# Patient Record
Sex: Female | Born: 1995 | Hispanic: No | Marital: Married | Smoking: Never smoker
Health system: Southern US, Community
[De-identification: ages and names within clinical notes are randomized; demographics above are authoritative.]

## PROBLEM LIST (undated history)

## (undated) ENCOUNTER — Inpatient Hospital Stay (HOSPITAL_COMMUNITY): Payer: Self-pay

## (undated) DIAGNOSIS — E119 Type 2 diabetes mellitus without complications: Secondary | ICD-10-CM

## (undated) HISTORY — PX: NO PAST SURGERIES: SHX2092

---
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
## 2014-12-16 ENCOUNTER — Inpatient Hospital Stay (HOSPITAL_COMMUNITY)
Admission: AD | Admit: 2014-12-16 | Discharge: 2014-12-16 | Disposition: A | Discharge status: Home / Self Care | Payer: PPO | Attending: Obstetrics & Gynecology | Admitting: Obstetrics & Gynecology

## 2014-12-16 ENCOUNTER — Other Ambulatory Visit: Payer: Self-pay | Admitting: Medical

## 2014-12-16 DIAGNOSIS — N63 Unspecified lump in unspecified breast: Secondary | ICD-10-CM

## 2014-12-16 NOTE — Discharge Instructions (Signed)
Fibrocystic Breast Changes Fibrocystic breast changes happen when tiny sacs filled with fluid form in the breast. They are not cancer. They can feel like lumps. HOME CARE  Check your breasts after every menstrual period or the first day of every month if you do not have menstrual periods. Check for:  Soreness.  New puffiness (swelling).  A change in breast size.  A change in a lump that was already there.  Only take medicine as told by your doctor.  Wear a support or sports bra that fits well.  Avoid caffeine in pop, chocolate, coffee, and tea. GET HELP IF:   You have fluid coming from your nipples, especially if it is bloody.  You have new lumps or bumps in your breast.  Your breast becomes puffy, red, and painful.  You have changes in how your breast looks.  Your nipples look flat or sunk in.   This information is not intended to replace advice given to you by your health care provider. Make sure you discuss any questions you have with your health care provider.   Document Released: 12/31/2007 Document Revised: 01/22/2013 Document Reviewed: 07/08/2012 Elsevier Interactive Patient Education [employer]2016 Elsevier Inc.

## 2014-12-16 NOTE — MAU Note (Signed)
Pt C/O lump in R breast since last Friday.  Lump is not painful.

## 2014-12-16 NOTE — MAU Provider Note (Signed)
History     CSN: [id number]  Arrival date and time: 12/16/14 1653   First Provider Initiated Contact with Patient 12/16/14 1709      Chief Complaint  Patient presents with  . Breast Mass   HPI [name] is a 19 y.o. G1P1001 who presents to MAU today with complaint of breast lump in the right breast since Friday. The patient states that this is a firm nodule. It is non-tender. There is no nipple drainage or bleeding. She discontinued breastfeeding a couple months ago. She denies fever.    OB History    Gravida Para Term Preterm AB TAB SAB Ectopic Multiple Living   Past Medical History  Diagnosis Date  . Diabetes mellitus without complication     gestational    Past Surgical History  Procedure Laterality Date  . No past surgeries      Family History  Problem Relation Age of Onset  . Alcohol abuse Neg Hx   . Arthritis Neg Hx   . Asthma Neg Hx   . Birth defects Neg Hx   . Cancer Neg Hx   . COPD Neg Hx   . Diabetes Neg Hx   . Depression Neg Hx   . Drug abuse Neg Hx   . Early death Neg Hx   . Hearing loss Neg Hx   . Heart disease Neg Hx   . Hyperlipidemia Neg Hx   . Hypertension Neg Hx   . Kidney disease Neg Hx   . Learning disabilities Neg Hx   . Mental illness Neg Hx   . Mental retardation Neg Hx   . Miscarriages / Stillbirths Neg Hx   . Stroke Neg Hx   . Vision loss Neg Hx   . Varicose Veins Neg Hx     Social History  Substance Use Topics  . Smoking status: Never Smoker   . Smokeless tobacco: Never Used  . Alcohol Use: No    Allergies: No Known Allergies  No prescriptions prior to admission    Review of Systems  Constitutional: Negative for fever and malaise/fatigue.  Genitourinary:       + breast lump   Physical Exam   Blood pressure 105/75, pulse 71, temperature 98.6 F (37 C), temperature source Oral, resp. rate 16, last menstrual period 12/12/2014, unknown if currently breastfeeding.  Physical Exam   Nursing note and vitals reviewed. Constitutional: She is oriented to person, place, and time. She appears well-developed and well-nourished. No distress.  HENT:  Head: Normocephalic and atraumatic.  Cardiovascular: Normal rate.   Respiratory: Effort normal. Right breast exhibits mass. Right breast exhibits no inverted nipple, no nipple discharge, no skin change and no tenderness. Left breast exhibits no inverted nipple, no mass, no nipple discharge, no skin change and no tenderness. Breasts are symmetrical.  Neurological: She is alert and oriented to person, place, and time.  Skin: Skin is warm and dry. No erythema.  Psychiatric: She has a normal mood and affect.    MAU Course  Procedures None  MDM Referral to breast center  Assessment and Plan  A: Lump in breast, right   P: Discharge home Advised Ibuprofen PRN for pain Warning signs for worsening condition discussed [facility] Patient may return to MAU as needed or if her condition were to change or worsen   Marny Lowenstein, PA-C  12/16/2014,  5:33 PM

## 2014-12-18 ENCOUNTER — Other Ambulatory Visit: Payer: Self-pay | Admitting: Medical

## 2014-12-18 DIAGNOSIS — N63 Unspecified lump in unspecified breast: Secondary | ICD-10-CM

## 2014-12-24 ENCOUNTER — Ambulatory Visit
Admission: RE | Admit: 2014-12-24 | Discharge: 2014-12-24 | Disposition: A | Discharge status: Home / Self Care | Payer: Commercial Managed Care - PPO | Source: Ambulatory Visit | Attending: Medical | Admitting: Medical

## 2014-12-24 DIAGNOSIS — N63 Unspecified lump in unspecified breast: Secondary | ICD-10-CM

## 2015-09-26 IMAGING — US US OB COMP LESS 14 WK
1 series · 13 of 13 positions shown · non-contrast
Comparison: None.

CLINICAL DATA: Bleeding.

EXAM:
OBSTETRIC <14 WK ULTRASOUND
TECHNIQUE: Transabdominal ultrasound was performed for evaluation of the
gestation as well as the maternal uterus and adnexal regions.

[Series 1: us ob comp less 14 wks · 13 acquisitions, 13 frames shown]
[im 1/13]
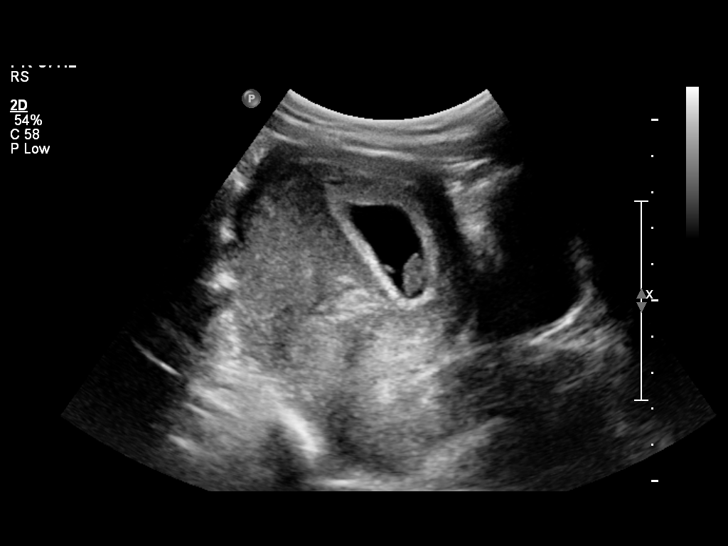
[im 2/13]
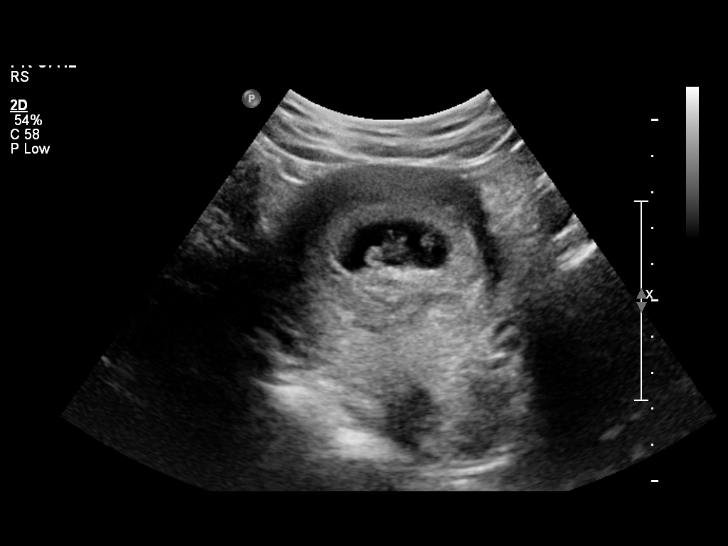
[im 3/13]
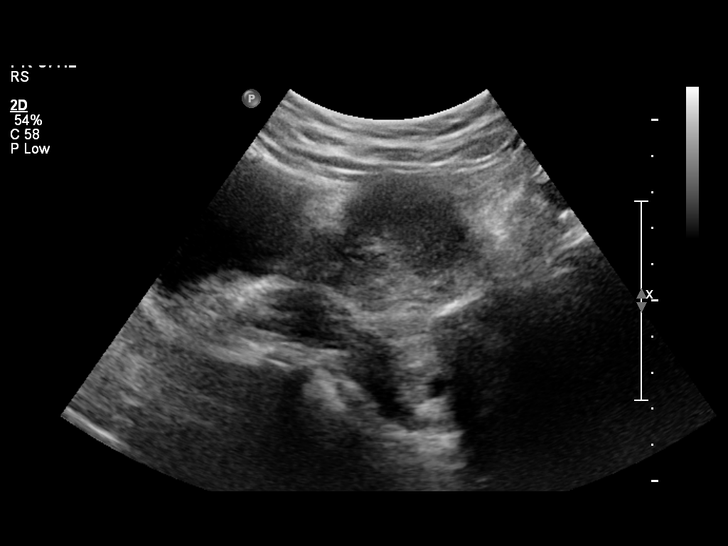
[im 4/13]
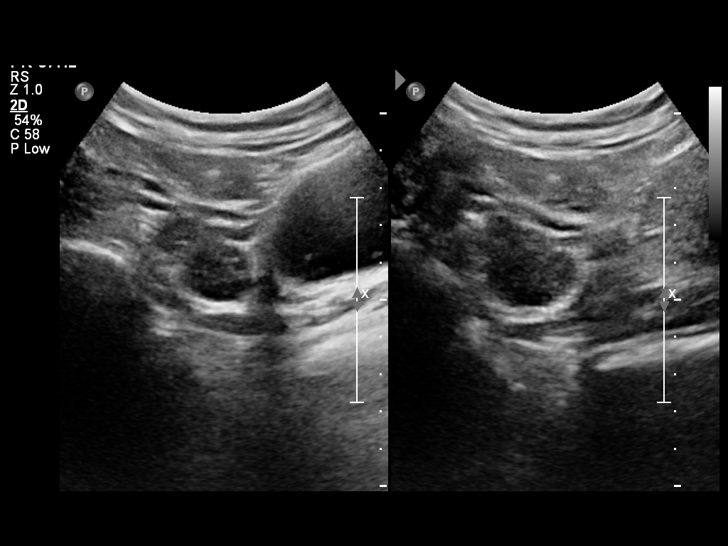
[im 5/13]
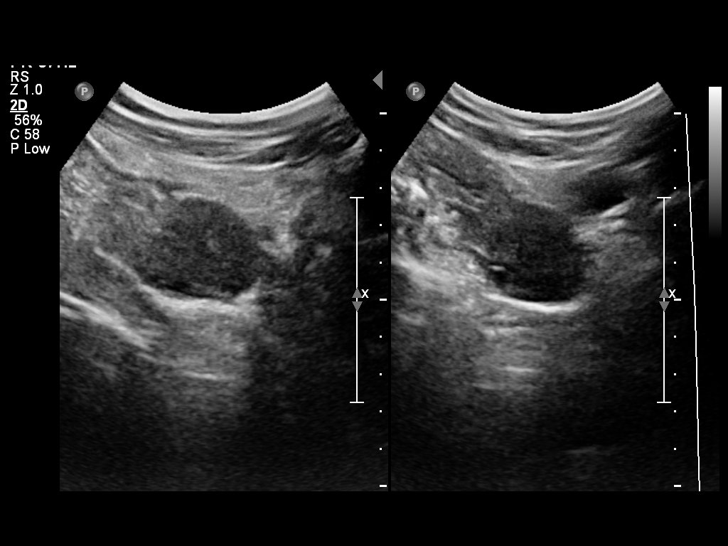
[im 6/13]
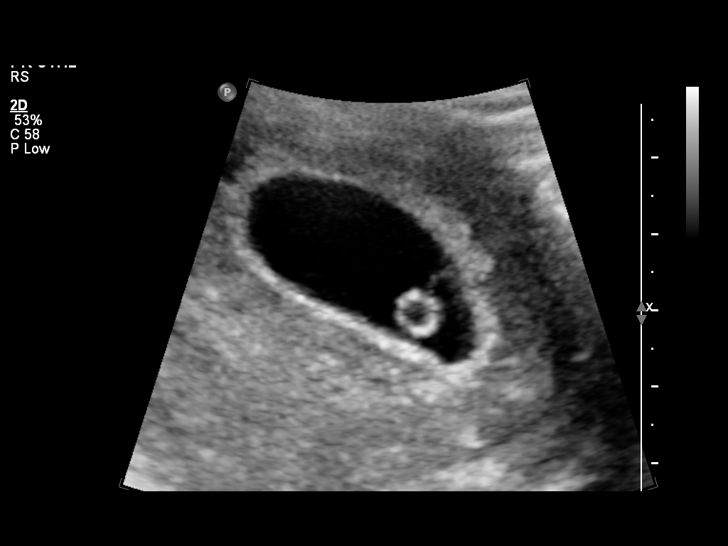
[im 7/13]
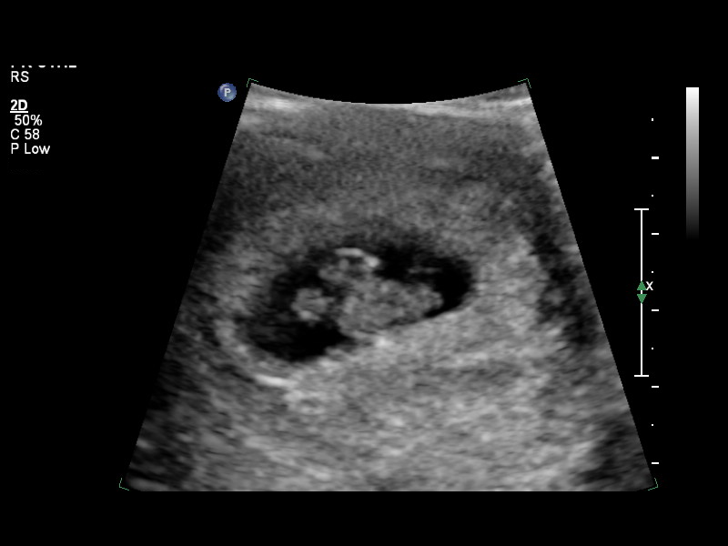
[im 8/13]
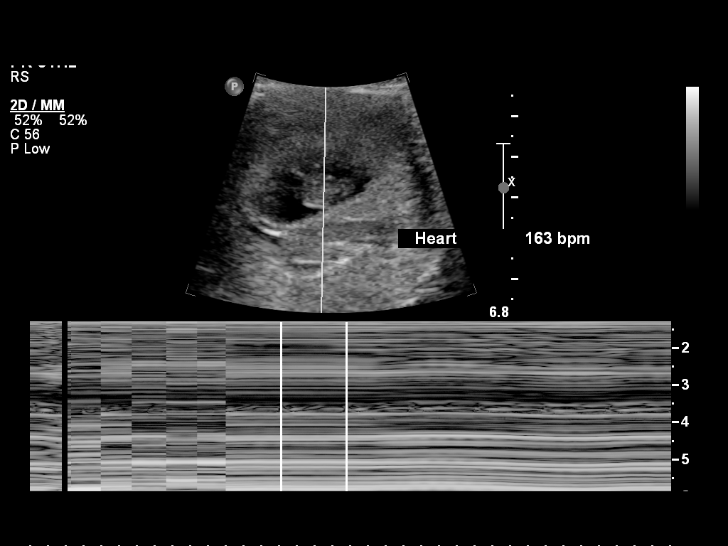
[im 9/13]
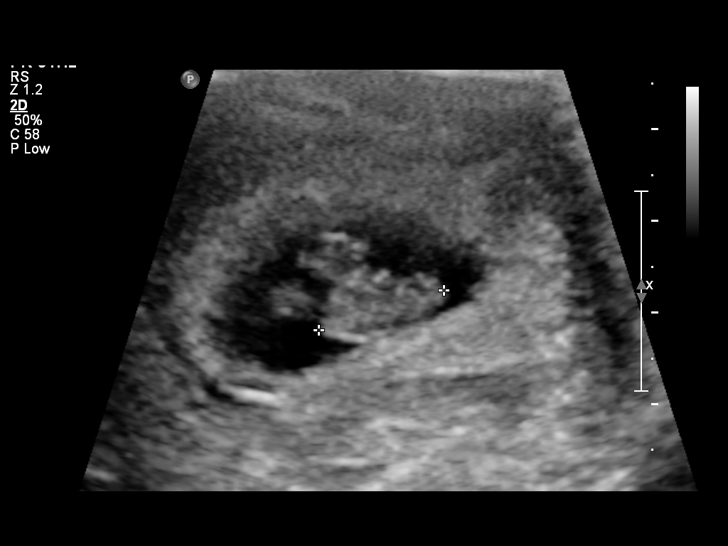
[im 10/13]
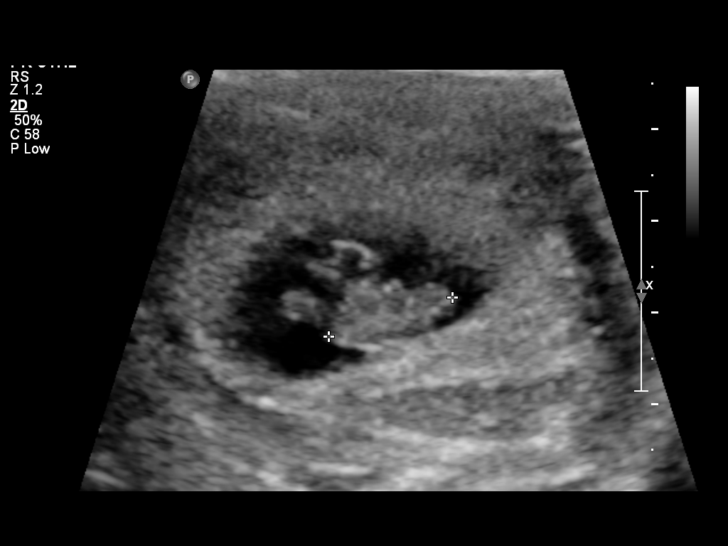
[im 11/13]
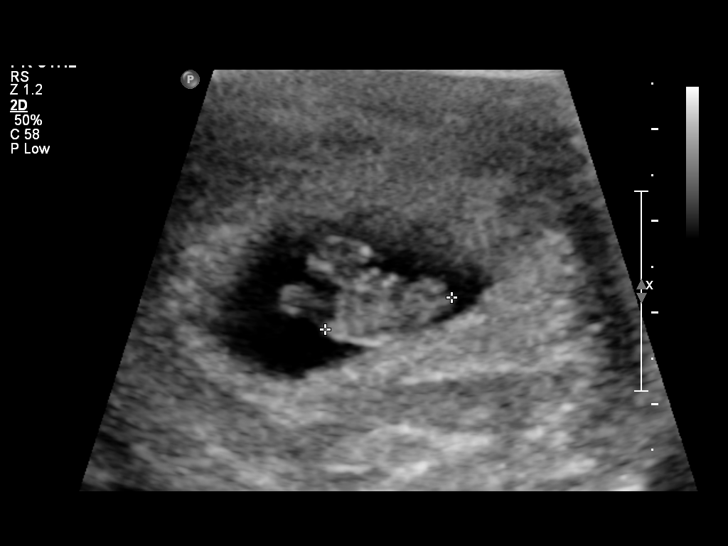
[im 12/13]
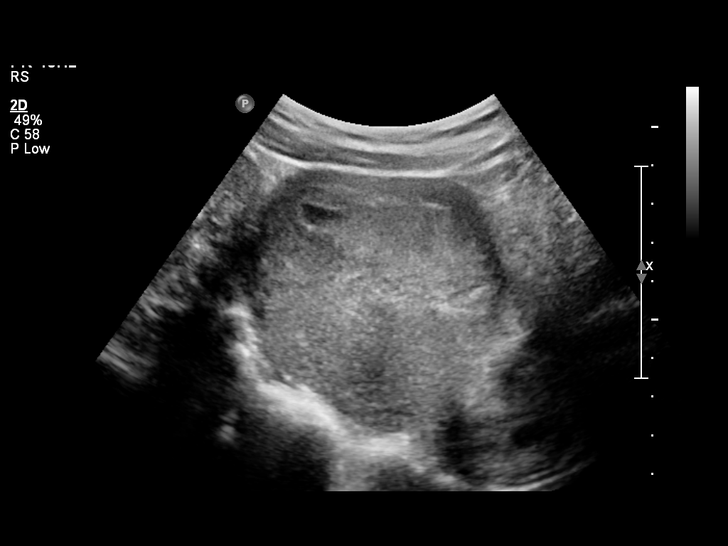
[im 13/13]
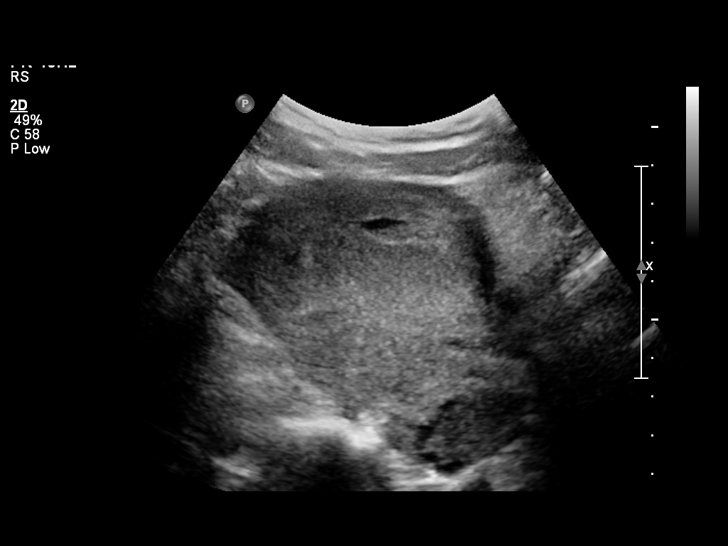

[13 of 13 positions shown; findings below may reference images not displayed]

FINDINGS: Intrauterine gestational sac: Visualized/normal in shape.

Yolk sac:  Yes

Embryo:  Yes

Cardiac Activity: Yes

Heart Rate: 163 bpm

CRL:   1.42 cm   7 w 6 d                  US EDC: 07/21/2013

Maternal uterus/adnexae:

Subchorionic hemorrhage: Small

Right ovary: Normal

Left ovary: Normal

Other :None

Free fluid: None
IMPRESSION: 1. Single living intrauterine gestation. The estimated gestational
age is 7 weeks and 6 days. 2. Small subchorionic hemorrhage

## 2016-01-20 ENCOUNTER — Ambulatory Visit (INDEPENDENT_AMBULATORY_CARE_PROVIDER_SITE_OTHER): Payer: Commercial Managed Care - PPO | Admitting: Urgent Care

## 2016-01-20 VITALS — BP 102/62 | HR 102 | Temp 99.0°F | Resp 16 | Ht 66.0 in | Wt 133.0 lb

## 2016-01-20 DIAGNOSIS — N3090 Cystitis, unspecified without hematuria: Secondary | ICD-10-CM | POA: Diagnosis not present

## 2016-01-20 DIAGNOSIS — Z3A01 Less than 8 weeks gestation of pregnancy: Secondary | ICD-10-CM | POA: Diagnosis not present

## 2016-01-20 DIAGNOSIS — Z603 Acculturation difficulty: Secondary | ICD-10-CM | POA: Diagnosis not present

## 2016-01-20 DIAGNOSIS — R630 Anorexia: Secondary | ICD-10-CM

## 2016-01-20 LAB — POCT URINALYSIS DIP (MANUAL ENTRY)
Bilirubin, UA: NEGATIVE
Glucose, UA: NEGATIVE
Leukocytes, UA: NEGATIVE
Nitrite, UA: POSITIVE — AB
Protein Ur, POC: NEGATIVE
UROBILINOGEN UA: 0.2
pH, UA: 5.5

## 2016-01-20 LAB — POCT CBC
Granulocyte percent: 46.7 %G (ref 37–80)
HCT, POC: 33.2 % — AB (ref 37.7–47.9)
Hemoglobin: 11 g/dL — AB (ref 12.2–16.2)
LYMPH, POC: 2.1 (ref 0.6–3.4)
MCH, POC: 24.4 pg — AB (ref 27–31.2)
MCHC: 33.2 g/dL (ref 31.8–35.4)
MCV: 73.5 fL — AB (ref 80–97)
MID (cbc): 0.4 (ref 0–0.9)
MPV: 8.5 fL (ref 0–99.8)
PLATELET COUNT, POC: 177 10*3/uL (ref 142–424)
POC Granulocyte: 2.1 (ref 2–6.9)
POC LYMPH %: 45.5 % (ref 10–50)
POC MID %: 7.8 %M (ref 0–12)
RBC: 4.52 M/uL (ref 4.04–5.48)
RDW, POC: 15.4 %
WBC: 4.6 10*3/uL (ref 4.6–10.2)

## 2016-01-20 LAB — POC MICROSCOPIC URINALYSIS (UMFC): MUCUS RE: ABSENT

## 2016-01-20 LAB — GLUCOSE, POCT (MANUAL RESULT ENTRY): POC Glucose: 99 mg/dl (ref 70–99)

## 2016-01-20 MED ORDER — DOXYLAMINE-PYRIDOXINE 10-10 MG PO TBEC: 2.0000 | DELAYED_RELEASE_TABLET | Freq: Two times a day (BID) | ORAL | 5 refills | Status: DC

## 2016-01-20 MED ORDER — NITROFURANTOIN MONOHYD MACRO 100 MG PO CAPS: 100.0000 mg | ORAL_CAPSULE | Freq: Two times a day (BID) | ORAL | 0 refills | Status: DC

## 2016-01-20 NOTE — Progress Notes (Signed)
MRN: [id number] DOB: 01/28/1996  Subjective:   Robyn Wade is a 20 y.o. female presenting for chief complaint of Anorexia (Pt is pregnant) and Nausea  Reports 4 day history of inability to eat food. She is able to drink fluids but this can worsen her ability to eat solid foods. Also reports nausea. [facility] and she found out she was [redacted] weeks pregnant. Has not tried medications for relief. Denies fever, vomiting, abdominal pain, dysuria, hematuria, constipation, diarrhea, bloody stools, vaginal discharge, vaginal bleeding.  Robyn Wade has a current medication list which includes the following prescription(s): ibuprofen and prenatal multivitamin. Also has No Known Allergies.  Robyn Wade  has a past medical history of Diabetes mellitus without complication (HCC). Also  has a past surgical history that includes No past surgeries.  Objective:   Vitals: BP 102/62   Pulse (!) 102   Temp 99 F (37.2 C) (Oral)   Resp 16   Ht 5\' 6"  (1.676 m)   Wt 133 lb (60.3 kg)   SpO2 99%   BMI 21.47 kg/m   Physical Exam  Constitutional: She is oriented to person, place, and time. She appears well-developed and well-nourished.  HENT:  Mouth/Throat: Oropharynx is clear and moist.  Eyes: No scleral icterus.  Cardiovascular: Normal rate, regular rhythm and intact distal pulses.  Exam reveals no gallop and no friction rub.   No murmur heard. Pulmonary/Chest: No respiratory distress. She has no wheezes. She has no rales.  Abdominal: Soft. Bowel sounds are normal. She exhibits no distension and no mass. There is tenderness. There is no guarding.  Neurological: She is alert and oriented to person, place, and time.  Skin: Skin is warm and dry.   Results for orders placed or performed in visit on 01/20/16 (from the past 24 hour(s))  POCT CBC     Status: Abnormal   Collection Time: 01/20/16  5:42 PM  Result Value Ref Range   WBC 4.6 4.6 - 10.2 K/uL   Lymph, poc 2.1  0.6 - 3.4   POC LYMPH PERCENT 45.5 10 - 50 %L   MID (cbc) 0.4 0 - 0.9   POC MID % 7.8 0 - 12 %M   POC Granulocyte 2.1 2 - 6.9   Granulocyte percent 46.7 37 - 80 %G   RBC 4.52 4.04 - 5.48 M/uL   Hemoglobin 11.0 (A) 12.2 - 16.2 g/dL   HCT, POC 56.233.2 (A) 13.037.7 - 47.9 %   MCV 73.5 (A) 80 - 97 fL   MCH, POC 24.4 (A) 27 - 31.2 pg   MCHC 33.2 31.8 - 35.4 g/dL   RDW, POC 86.515.4 %   Platelet Count, POC 177 142 - 424 K/uL   MPV 8.5 0 - 99.8 fL  POCT glucose (manual entry)     Status: None   Collection Time: 01/20/16  5:44 PM  Result Value Ref Range   POC Glucose 99 70 - 99 mg/dl  POCT urinalysis dipstick     Status: Abnormal   Collection Time: 01/20/16  5:56 PM  Result Value Ref Range   Color, UA yellow yellow   Clarity, UA clear clear   Glucose, UA negative negative   Bilirubin, UA negative negative   Ketones, POC UA trace (5) (A) negative   Spec Grav, UA >=1.030    Blood, UA trace-intact (A) negative   pH, UA 5.5    Protein Ur, POC negative negative   Urobilinogen, UA 0.2  Nitrite, UA Positive (A) Negative   Leukocytes, UA Negative Negative   Assessment and Plan :   1. Cystitis 2. Anorexia 3. Less than [redacted] weeks gestation of pregnancy 4. Language barrier, cultural differences - Start Macrobid twice daily. Urine culture pending. Patient is to start Diclegis BID, establish care with an OB.  Wallis BambergMario Ayden Apodaca, PA-C [facility] [phone] 01/20/2016 5:19 PM

## 2016-01-20 NOTE — Patient Instructions (Addendum)
Pregnancy and Urinary Tract Infection WHAT IS A URINARY TRACT INFECTION? A urinary tract infection (UTI) is an infection of any part of the urinary tract. This includes the kidneys, the tubes that connect your kidneys to your bladder (ureters), the bladder, and the tube that carries urine out of your body (urethra). These organs make, store, and get rid of urine in the body. A UTI can be a bladder infection (cystitis) or a kidney infection (pyelonephritis). This infection may be caused by fungi, viruses, and bacteria. Bacteria are the most common cause of UTIs. You are more likely to develop a UTI during pregnancy because:  The physical and hormonal changes your body goes through can make it easier for bacteria to get into your urinary tract.  Your growing baby puts pressure on your uterus and can affect urine flow. DOES A UTI PLACE MY BABY AT RISK? An untreated UTI during pregnancy could lead to a kidney infection, which can cause health problems that could affect your baby. Possible complications of an untreated UTI include:  Having your baby before 37 weeks of pregnancy (premature).  Having a baby with a low birth weight.  Developing high blood pressure during pregnancy (preeclampsia). WHAT ARE THE SYMPTOMS OF A UTI? Symptoms of a UTI include:  Fever.  Frequent urination or passing small amounts of urine frequently.  Needing to urinate urgently.  Pain or a burning sensation with urination.  Urine that smells bad or unusual.  Cloudy urine.  Pain in the lower abdomen or back.  Trouble urinating.  Blood in the urine.  Vomiting or being less hungry than normal.  Diarrhea or abdominal pain.  Vaginal discharge. WHAT ARE THE TREATMENT OPTIONS FOR A UTI DURING PREGNANCY? Treatment for this condition may include:  Antibiotic medicines that are safe to take during pregnancy.  Other medicines to treat less common causes of UTI. HOW CAN I PREVENT A UTI? To prevent a UTI:  Go  to the bathroom as soon as you feel the need.  Always wipe from front to back.  Wash your genital area with soap and warm water daily.  Empty your bladder before and after sex.  Wear cotton underwear.  Limit your intake of high sugar foods or drinks, such as regular soda, juice, and sweets..  Drink 6-8 glasses of water daily.  Do not wear tight-fitting pants.  Do not douche or use deodorant sprays.  Do not drink alcohol, caffeine, or carbonated drinks. These can irritate the bladder. WHEN SHOULD I SEEK MEDICAL CARE? Seek medical care if:  Your symptoms do not improve or get worse.  You have a fever after two days of treatment.  You have a rash.  You have abnormal vaginal discharge.  You have back or side pain.  You have chills.  You have nausea and vomiting. WHEN SHOULD I SEEK IMMEDIATE MEDICAL CARE? Seek immediate medical care if you are pregnant and:  You feel contractions in your uterus.  You have lower belly pain.  You have a gush of fluid from your vagina.  You have blood in your urine.  You are vomiting and cannot keep down any medicines or water. This information is not intended to replace advice given to you by your health care provider. Make sure you discuss any questions you have with your health care provider. Document Released: 05/14/2010 Document Revised: 06/22/2015 Document Reviewed: 12/08/2014 Elsevier Interactive Patient Education  2017 [employer]     IF you received an x-ray today, you will receive  an Economistinvoice from [facility]. Please contact [facility] at [phone] with questions or concerns regarding your invoice.   IF you received labwork today, you will receive an invoice from [facility]. Please contact [facility] at [phone] with questions or concerns regarding your invoice.   Our billing staff will not be able to assist you with questions regarding bills from these companies.  You will be contacted with the  lab results as soon as they are available. The fastest way to get your results is to activate your My Chart account. Instructions are located on the last page of this paperwork. If you have not heard from us regarding the results in 2 weeks, please contact this office.

## 2016-01-22 LAB — URINE CULTURE

## 2016-01-23 ENCOUNTER — Inpatient Hospital Stay (HOSPITAL_COMMUNITY): Payer: PPO

## 2016-01-23 ENCOUNTER — Encounter (HOSPITAL_COMMUNITY): Payer: Self-pay | Admitting: *Deleted

## 2016-01-23 ENCOUNTER — Inpatient Hospital Stay (HOSPITAL_COMMUNITY)
Admission: AD | Admit: 2016-01-23 | Discharge: 2016-01-24 | Disposition: A | Discharge status: Home / Self Care | Payer: PPO | Source: Ambulatory Visit | Attending: Obstetrics & Gynecology | Admitting: Obstetrics & Gynecology

## 2016-01-23 DIAGNOSIS — N939 Abnormal uterine and vaginal bleeding, unspecified: Secondary | ICD-10-CM | POA: Diagnosis present

## 2016-01-23 DIAGNOSIS — O209 Hemorrhage in early pregnancy, unspecified: Secondary | ICD-10-CM | POA: Diagnosis not present

## 2016-01-23 DIAGNOSIS — O99611 Diseases of the digestive system complicating pregnancy, first trimester: Secondary | ICD-10-CM | POA: Diagnosis not present

## 2016-01-23 DIAGNOSIS — O219 Vomiting of pregnancy, unspecified: Secondary | ICD-10-CM | POA: Diagnosis not present

## 2016-01-23 DIAGNOSIS — K5900 Constipation, unspecified: Secondary | ICD-10-CM

## 2016-01-23 DIAGNOSIS — Z3A01 Less than 8 weeks gestation of pregnancy: Secondary | ICD-10-CM | POA: Diagnosis not present

## 2016-01-23 DIAGNOSIS — O4690 Antepartum hemorrhage, unspecified, unspecified trimester: Secondary | ICD-10-CM

## 2016-01-23 LAB — POCT PREGNANCY, URINE: Preg Test, Ur: POSITIVE — AB

## 2016-01-23 LAB — CBC
HCT: 33.5 % — ABNORMAL LOW (ref 36.0–46.0)
HEMOGLOBIN: 10.8 g/dL — AB (ref 12.0–15.0)
MCH: 24.4 pg — ABNORMAL LOW (ref 26.0–34.0)
MCHC: 32.2 g/dL (ref 30.0–36.0)
MCV: 75.6 fL — AB (ref 78.0–100.0)
Platelets: 202 10*3/uL (ref 150–400)
RBC: 4.43 MIL/uL (ref 3.87–5.11)
RDW: 15.1 % (ref 11.5–15.5)
WBC: 5.3 10*3/uL (ref 4.0–10.5)

## 2016-01-23 LAB — URINALYSIS, ROUTINE W REFLEX MICROSCOPIC
Bilirubin Urine: NEGATIVE
Glucose, UA: NEGATIVE mg/dL
KETONES UR: NEGATIVE mg/dL
Nitrite: NEGATIVE
PH: 6 (ref 5.0–8.0)
Protein, ur: NEGATIVE mg/dL
SPECIFIC GRAVITY, URINE: 1.014 (ref 1.005–1.030)

## 2016-01-23 NOTE — MAU Note (Signed)
Unable to have BM . Last BM 3-4days ago. Some spotting in past couple days. No pain

## 2016-01-24 DIAGNOSIS — K5900 Constipation, unspecified: Secondary | ICD-10-CM | POA: Diagnosis not present

## 2016-01-24 DIAGNOSIS — O4690 Antepartum hemorrhage, unspecified, unspecified trimester: Secondary | ICD-10-CM

## 2016-01-24 DIAGNOSIS — O219 Vomiting of pregnancy, unspecified: Secondary | ICD-10-CM | POA: Diagnosis not present

## 2016-01-24 DIAGNOSIS — O99611 Diseases of the digestive system complicating pregnancy, first trimester: Secondary | ICD-10-CM

## 2016-01-24 LAB — WET PREP, GENITAL
CLUE CELLS WET PREP: NONE SEEN
SPERM: NONE SEEN
Trich, Wet Prep: NONE SEEN
Yeast Wet Prep HPF POC: NONE SEEN

## 2016-01-24 LAB — HCG, QUANTITATIVE, PREGNANCY: hCG, Beta Chain, Quant, S: 61898 m[IU]/mL — ABNORMAL HIGH (ref <5)

## 2016-01-24 LAB — HIV ANTIBODY (ROUTINE TESTING W REFLEX): HIV SCREEN 4TH GENERATION: NONREACTIVE

## 2016-01-24 MED ORDER — POLYETHYLENE GLYCOL 3350 17 G PO PACK
17.0000 g | PACK | Freq: Once | ORAL | Status: AC
  Administered 2016-01-24: 17 g via ORAL
  Filled 2016-01-24: qty 1

## 2016-01-24 MED ORDER — PROMETHAZINE HCL 25 MG PO TABS: 25.0000 mg | ORAL_TABLET | Freq: Four times a day (QID) | ORAL | 2 refills | Status: DC | PRN

## 2016-01-24 MED ORDER — POLYETHYLENE GLYCOL 3350 17 GM/SCOOP PO POWD: 17.0000 g | Freq: Every day | ORAL | 2 refills | Status: AC | PRN

## 2016-01-24 MED ORDER — PROMETHAZINE HCL 25 MG PO TABS
25.0000 mg | ORAL_TABLET | Freq: Once | ORAL | Status: AC
  Administered 2016-01-24: 25 mg via ORAL
  Filled 2016-01-24 (×2): qty 1

## 2016-01-24 NOTE — Discharge Instructions (Signed)
Constipation, Adult Constipation is when a person has fewer bowel movements in a week than normal, has difficulty having a bowel movement, or has stools that are dry, hard, or larger than normal. Constipation may be caused by an underlying condition. It may become worse with age if a person takes certain medicines and does not take in enough fluids. Follow these instructions at home: Eating and drinking  Eat foods that have a lot of fiber, such as fresh fruits and vegetables, whole grains, and beans.  Limit foods that are high in fat, low in fiber, or overly processed, such as french fries, hamburgers, cookies, candies, and soda.  Drink enough fluid to keep your urine clear or pale yellow. General instructions  Exercise regularly or as told by your health care provider.  Go to the restroom when you have the urge to go. Do not hold it in.  Take over-the-counter and prescription medicines only as told by your health care provider. These include any fiber supplements.  Practice pelvic floor retraining exercises, such as deep breathing while relaxing the lower abdomen and pelvic floor relaxation during bowel movements.  Watch your condition for any changes.  Keep all follow-up visits as told by your health care provider. This is important. Contact a health care provider if:  You have pain that gets worse.  You have a fever.  You do not have a bowel movement after 4 days.  You vomit.  You are not hungry.  You lose weight.  You are bleeding from the anus.  You have thin, pencil-like stools. Get help right away if:  You have a fever and your symptoms suddenly get worse.  You leak stool or have blood in your stool.  Your abdomen is bloated.  You have severe pain in your abdomen.  You feel dizzy or you faint. This information is not intended to replace advice given to you by your health care provider. Make sure you discuss any questions you have with your health care  provider. Document Released: 10/16/2003 Document Revised: 08/07/2015 Document Reviewed: 07/08/2015 Elsevier Interactive Patient Education  2017 Elsevier Inc.   High-Fiber Diet Fiber, also called dietary fiber, is a type of carbohydrate found in fruits, vegetables, whole grains, and beans. A high-fiber diet can have many health benefits. Your health care provider may recommend a high-fiber diet to help:  Prevent constipation. Fiber can make your bowel movements more regular.  Lower your cholesterol.  Relieve hemorrhoids, uncomplicated diverticulosis, or irritable bowel syndrome.  Prevent overeating as part of a weight-loss plan.  Prevent heart disease, type 2 diabetes, and certain cancers. What is my plan? The recommended daily intake of fiber includes:  38 grams for men under age 21.  30 grams for men over age 21.  25 grams for women under age 21.  21 grams for women over age 21. You can get the recommended daily intake of dietary fiber by eating a variety of fruits, vegetables, grains, and beans. Your health care provider may also recommend a fiber supplement if it is not possible to get enough fiber through your diet. What do I need to know about a high-fiber diet?  Fiber supplements have not been widely studied for their effectiveness, so it is better to get fiber through food sources.  Always check the fiber content on thenutrition facts label of any prepackaged food. Look for foods that contain at least 5 grams of fiber per serving.  Ask your dietitian if you have questions about specific foods  that are related to your condition, especially if those foods are not listed in the following section.  Increase your daily fiber consumption gradually. Increasing your intake of dietary fiber too quickly may cause bloating, cramping, or gas.  Drink plenty of water. Water helps you to digest fiber. What foods can I eat? Grains  Whole-grain breads. Multigrain cereal. Oats and  oatmeal. Brown rice. Barley. Bulgur wheat. Millet. Bran muffins. Popcorn. Rye wafer crackers. Vegetables  Sweet potatoes. Spinach. Kale. Artichokes. Cabbage. Broccoli. Green peas. Carrots. Squash. Fruits  Berries. Pears. Apples. Oranges. Avocados. Prunes and raisins. Dried figs. Meats and Other Protein Sources  Navy, kidney, pinto, and soy beans. Split peas. Lentils. Nuts and seeds. Dairy  Fiber-fortified yogurt. Beverages  Fiber-fortified soy milk. Fiber-fortified orange juice. Other  Fiber bars. The items listed above may not be a complete list of recommended foods or beverages. Contact your dietitian for more options.  What foods are not recommended? Grains  White bread. Pasta made with refined flour. White rice. Vegetables  Fried potatoes. Canned vegetables. Well-cooked vegetables. Fruits  Fruit juice. Cooked, strained fruit. Meats and Other Protein Sources  Fatty cuts of meat. Fried Environmental education officerpoultry or fried fish. Dairy  Milk. Yogurt. Cream cheese. Sour cream. Beverages  Soft drinks. Other  Cakes and pastries. Butter and oils. The items listed above may not be a complete list of foods and beverages to avoid. Contact your dietitian for more information.  What are some tips for including high-fiber foods in my diet?  Eat a wide variety of high-fiber foods.  Make sure that half of all grains consumed each day are whole grains.  Replace breads and cereals made from refined flour or white flour with whole-grain breads and cereals.  Replace white rice with brown rice, bulgur wheat, or millet.  Start the day with a breakfast that is high in fiber, such as a cereal that contains at least 5 grams of fiber per serving.  Use beans in place of meat in soups, salads, or pasta.  Eat high-fiber snacks, such as berries, raw vegetables, nuts, or popcorn. This information is not intended to replace advice given to you by your health care provider. Make sure you discuss any questions you have  with your health care provider. Document Released: 01/17/2005 Document Revised: 06/25/2015 Document Reviewed: 07/02/2013 Elsevier Interactive Patient Education  2017 Elsevier Inc.   Vaginal Bleeding During Pregnancy, First Trimester A small amount of bleeding (spotting) from the vagina is common in early pregnancy. Sometimes the bleeding is normal and is not a problem, and sometimes it is a sign of something serious. Be sure to tell your doctor about any bleeding from your vagina right away. Follow these instructions at home:  Watch your condition for any changes.  Follow your doctor's instructions about how active you can be.  If you are on bed rest:  You may need to stay in bed and only get up to use the bathroom.  You may be allowed to do some activities.  If you need help, make plans for someone to help you.  Write down:  The number of pads you use each day.  How often you change pads.  How soaked (saturated) your pads are.  Do not use tampons.  Do not douche.  Do not have sex or orgasms until your doctor says it is okay.  If you pass any tissue from your vagina, save the tissue so you can show it to your doctor.  Only take medicines as told  by your doctor.  Do not take aspirin because it can make you bleed.  Keep all follow-up visits as told by your doctor. Contact a doctor if:  You bleed from your vagina.  You have cramps.  You have labor pains.  You have a fever that does not go away after you take medicine. Get help right away if:  You have very bad cramps in your back or belly (abdomen).  You pass large clots or tissue from your vagina.  You bleed more.  You feel light-headed or weak.  You pass out (faint).  You have chills.  You are leaking fluid or have a gush of fluid from your vagina.  You pass out while pooping (having a bowel movement). This information is not intended to replace advice given to you by your health care provider. Make  sure you discuss any questions you have with your health care provider. Document Released: 06/03/2013 Document Revised: 06/25/2015 Document Reviewed: 09/24/2012 Elsevier Interactive Patient Education  2017 Elsevier Inc.  [place] [facility] Providers    [facility] [employer] [facility]       Phone: [phone]  [facility] [employer] at [place] Phone: [phone]  [facility] [employer] at [place]  Phone: [phone]  [facility] for [facility]  Phone: [phone]  [facility] for [facility] at [place]  Phone: [phone]  [facility]       Phone: [phone]  [facility]    Phone: [phone]   [facility] [place])    Phone: [phone]  [facility]    Phone: [phone]  [facility]    Phone: [phone]  [facility]    Phone: [phone]  [facility]       Phone: [phone]   [facility]  Phone: [phone]  [facility]    Phone: [phone]  [facility]   Phone: [phone]  [facility]      Phone: [phone]  [facility]    Phone: [phone]

## 2016-01-24 NOTE — MAU Provider Note (Signed)
Chief Complaint: Constipation   First Provider Initiated Contact with Patient 01/24/16 0045     SUBJECTIVE HPI: Robyn Wade is a 20 y.o. G3P1001 at 1772w4d who presents to Maternity Admissions reporting light vaginal bleeding x a few days and constipation. No BM in 3-4 days. Recently Rx'd Diclegis for N/V (which is helping), and thinks constipation is from Diclegis. Hasn't tried any dietary changes or medication for constipation.   Associated signs and symptoms: Neg for abd pain , passage of clots or tissue.   Pt requesting to use husband for interpreting. Declined hospital interpreter.   Past Medical History:  Diagnosis Date  . Diabetes mellitus without complication (HCC)    gestational   OB History  Gravida Para Term Preterm AB Living  3 1 1     1   SAB TAB Ectopic Multiple Live Births          1    # Outcome Date GA Lbr Len/2nd Weight Sex Delivery Anes PTL Lv  3 Current           2 Term 07/16/13 793w2d 14:39 / 01:07 8 lb 6.9 oz (3.824 kg) M Vag-Spont None  LIV  1 Gravida              Past Surgical History:  Procedure Laterality Date  . NO PAST SURGERIES     Social History   Social History  . Marital status: Married    Spouse name: N/A  . Number of children: N/A  . Years of education: N/A   Occupational History  . Not on file.   Social History Main Topics  . Smoking status: Never Smoker  . Smokeless tobacco: Never Used  . Alcohol use No  . Drug use: No  . Sexual activity: Yes    Birth control/ protection: None   Other Topics Concern  . Not on file   Social History Narrative  . No narrative on file   No current facility-administered medications on file prior to encounter.    Current Outpatient Prescriptions on File Prior to Encounter  Medication Sig Dispense Refill  . Doxylamine-Pyridoxine 10-10 MG TBEC Take 2 tablets by mouth 2 (two) times daily. DO NOT TAKE MORE THAN 4 TABLETS DAILY. 60 tablet 5  . nitrofurantoin, macrocrystal-monohydrate, (MACROBID)  100 MG capsule Take 1 capsule (100 mg total) by mouth 2 (two) times daily. 14 capsule 0  . Prenatal Vit-Fe Fumarate-FA (PRENATAL MULTIVITAMIN) TABS tablet Take 1 tablet by mouth daily at 12 noon. 30 tablet 1  . ibuprofen (ADVIL,MOTRIN) 600 MG tablet Take 1 tablet (600 mg total) by mouth every 6 (six) hours. (Patient not taking: Reported on 01/20/2016) 30 tablet 0   No Known Allergies  I have reviewed the past Medical Hx, Surgical Hx, Social Hx, Allergies and Medications.   Review of Systems  Constitutional: Negative for appetite change, chills and fever.  Gastrointestinal: Positive for constipation and nausea. Negative for abdominal pain, diarrhea and vomiting.  Genitourinary: Positive for vaginal bleeding. Negative for hematuria and vaginal discharge.  Musculoskeletal: Negative for back pain.  Neurological: Negative for dizziness.    OBJECTIVE Patient Vitals for the past 24 hrs:  BP Temp Pulse Resp Height Weight  01/23/16 2239 116/69 98.2 F (36.8 C) 72 18 5\' 5"  (1.651 m) 134 lb 6.4 oz (61 kg)   Constitutional: Well-developed, well-nourished female in no acute distress.  Cardiovascular: normal rate Respiratory: normal rate and effort.  GI: Abd soft, non-tender. Pos BS x 4 MS: Extremities nontender,  no edema, normal ROM Neurologic: Alert and oriented x 4.  SPECULUM EXAM: Refused. Collected her own wet prep, GC/Chlmaydia swabs.   LAB RESULTS Results for orders placed or performed during the hospital encounter of 01/23/16 (from the past 24 hour(s))  Urinalysis, Routine w reflex microscopic     Status: Abnormal   Collection Time: 01/23/16 10:50 PM  Result Value Ref Range   Color, Urine YELLOW YELLOW   APPearance HAZY (A) CLEAR   Specific Gravity, Urine 1.014 1.005 - 1.030   pH 6.0 5.0 - 8.0   Glucose, UA NEGATIVE NEGATIVE mg/dL   Hgb urine dipstick SMALL (A) NEGATIVE   Bilirubin Urine NEGATIVE NEGATIVE   Ketones, ur NEGATIVE NEGATIVE mg/dL   Protein, ur NEGATIVE NEGATIVE mg/dL    Nitrite NEGATIVE NEGATIVE   Leukocytes, UA MODERATE (A) NEGATIVE   RBC / HPF 0-5 0 - 5 RBC/hpf   WBC, UA 6-30 0 - 5 WBC/hpf   Bacteria, UA RARE (A) NONE SEEN   Squamous Epithelial / LPF 0-5 (A) NONE SEEN   Mucous PRESENT   Pregnancy, urine POC     Status: Abnormal   Collection Time: 01/23/16 11:02 PM  Result Value Ref Range   Preg Test, Ur POSITIVE (A) NEGATIVE  hCG, quantitative, pregnancy     Status: Abnormal   Collection Time: 01/23/16 11:41 PM  Result Value Ref Range   hCG, Beta Chain, Quant, S 61,898 (H) <5 mIU/mL  CBC     Status: Abnormal   Collection Time: 01/23/16 11:41 PM  Result Value Ref Range   WBC 5.3 4.0 - 10.5 K/uL   RBC 4.43 3.87 - 5.11 MIL/uL   Hemoglobin 10.8 (L) 12.0 - 15.0 g/dL   HCT 16.1 (L) 09.6 - 04.5 %   MCV 75.6 (L) 78.0 - 100.0 fL   MCH 24.4 (L) 26.0 - 34.0 pg   MCHC 32.2 30.0 - 36.0 g/dL   RDW 40.9 81.1 - 91.4 %   Platelets 202 150 - 400 K/uL  Wet prep, genital     Status: Abnormal   Collection Time: 01/24/16  1:07 AM  Result Value Ref Range   Yeast Wet Prep HPF POC NONE SEEN NONE SEEN   Trich, Wet Prep NONE SEEN NONE SEEN   Clue Cells Wet Prep HPF POC NONE SEEN NONE SEEN   WBC, Wet Prep HPF POC MODERATE (A) NONE SEEN   Sperm NONE SEEN     IMAGING US Ob Comp Less 14 Wks  Result Date: 01/24/2016 CLINICAL DATA:  Spotting for 3 days EXAM: OBSTETRIC <14 WK ULTRASOUND TECHNIQUE: Transabdominal ultrasound was performed for evaluation of the gestation as well as the maternal uterus and adnexal regions. COMPARISON:  None. FINDINGS: Intrauterine gestational sac: Single Yolk sac:  Visible Embryo:  Visible Cardiac Activity: Visible Heart Rate: 125 bpm MSD:   mm    w     d CRL:   6.3  mm   6 w 3 d                  [place] EDC: 09/14/2016 Subchorionic hemorrhage:  None visualized. Maternal uterus/adnexae: Normal ovaries. No abnormal pelvic fluid collections. IMPRESSION: Single living intrauterine gestation measuring 6 weeks 3 days by crown-rump length.  Electronically Signed   By: Ellery Plunk M.D.   On: 01/24/2016 00:24    MAU COURSE  Orders Placed This Encounter  Procedures  . Wet prep, genital  . US OB Comp Less 14 Wks  . Urinalysis, Routine w reflex  microscopic  . hCG, quantitative, pregnancy  . CBC  . Pregnancy, urine POC   Meds ordered this encounter  Medications  . promethazine (PHENERGAN) tablet 25 mg  . polyethylene glycol (MIRALAX / GLYCOLAX) packet 17 g  . promethazine (PHENERGAN) 25 MG tablet    Sig: Take 1 tablet (25 mg total) by mouth every 6 (six) hours as needed.    Dispense:  30 tablet    Refill:  2    Order Specific Question:   Supervising Provider    Answer:   Jaynie CollinsANYANWU, UGONNA A [[id number]]  . polyethylene glycol powder (GLYCOLAX/MIRALAX) powder    Sig: Take 17 g by mouth daily as needed for moderate constipation.    Dispense:  500 g    Refill:  2    Order Specific Question:   Supervising Provider    Answer:   Jaynie CollinsANYANWU, UGONNA A [[id number]]    MDM - Vaginal bleeding in early pregnancy with normal intrauterine pregnancy and hemodynamically stable.  - Constipation. Increase fluids and fiber. Rx Miralax.  ASSESSMENT 1. Constipation during pregnancy, first trimester   2. Vaginal bleeding in pregnancy   3. Nausea and vomiting of pregnancy, antepartum     PLAN Discharge home in stable condition. Bleeding precautions Follow-up Information    Obstetrician of your choice Follow up.   Why:  Start prenatal care       [facility] MATERNITY ADMISSIONS Follow up.   Why:  as needed in emergencies Contact information: [address] [id number] mc [place] [phone] [phone]         Allergies as of 01/24/2016   No Known Allergies     Medication List    STOP taking these medications   ibuprofen 600 MG tablet Commonly known as:  ADVIL,MOTRIN     TAKE these medications   Doxylamine-Pyridoxine 10-10 MG Tbec Take 2 tablets by mouth 2 (two) times daily.  DO NOT TAKE MORE THAN 4 TABLETS DAILY.   nitrofurantoin (macrocrystal-monohydrate) 100 MG capsule Commonly known as:  MACROBID Take 1 capsule (100 mg total) by mouth 2 (two) times daily.   polyethylene glycol powder powder Commonly known as:  GLYCOLAX/MIRALAX Take 17 g by mouth daily as needed for moderate constipation.   prenatal multivitamin Tabs tablet Take 1 tablet by mouth daily at 12 noon.   promethazine 25 MG tablet Commonly known as:  PHENERGAN Take 1 tablet (25 mg total) by mouth every 6 (six) hours as needed.        SimpsonVirginia Lanasia Porras, CNM 01/24/2016  1:59 AM  4

## 2016-01-26 ENCOUNTER — Encounter: Payer: Self-pay | Admitting: Urgent Care

## 2016-01-26 LAB — GC/CHLAMYDIA PROBE AMP (~~LOC~~) NOT AT ARMC
Chlamydia: NEGATIVE
Neisseria Gonorrhea: NEGATIVE

## 2016-02-01 NOTE — L&D Delivery Note (Addendum)
Delivery Note  Pt presented in spontaneous labor. Augmented with AROM.   At 6:23 PM a viable female was delivered via Vaginal, Spontaneous Delivery (Presentation: oa  ).  APGAR: pending; weight pending Placenta status: intact, but with trailing membrane .  Cord: 3v. with the following complications: none.  Cord pH: not obtained  Anesthesia:  IV fentanyl Episiotomy: None Lacerations: None Est. Blood Loss (mL):  200  Mom to postpartum.  Baby to Couplet care / Skin to Skin.  Silvano BilisNoah B Ayleen Mckinstry, supervising Dr. Talbert ForestShirley 09/06/2016, 6:49 PM

## 2016-04-26 IMAGING — US US OB FOLLOW-UP
1 series · 12 of 28 positions shown · non-contrast
Comparison: none

[Series 1: us ob follow up · 12 of 46 slices shown]
[im 2/46]
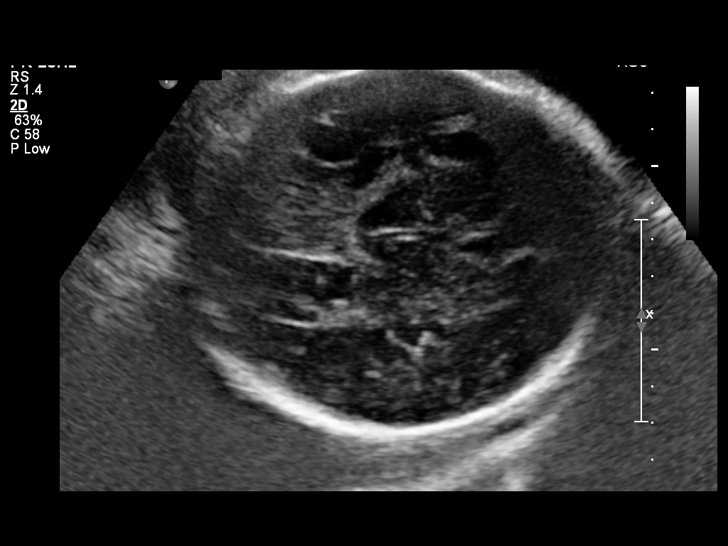
[im 6/46]
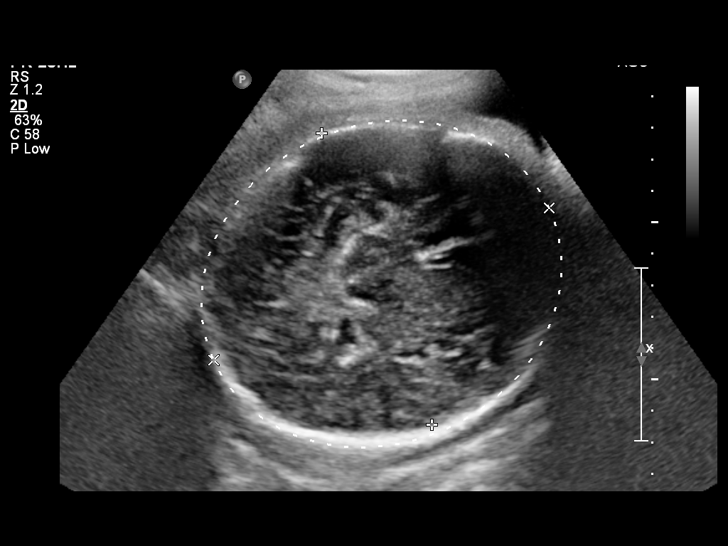
[im 9/46]
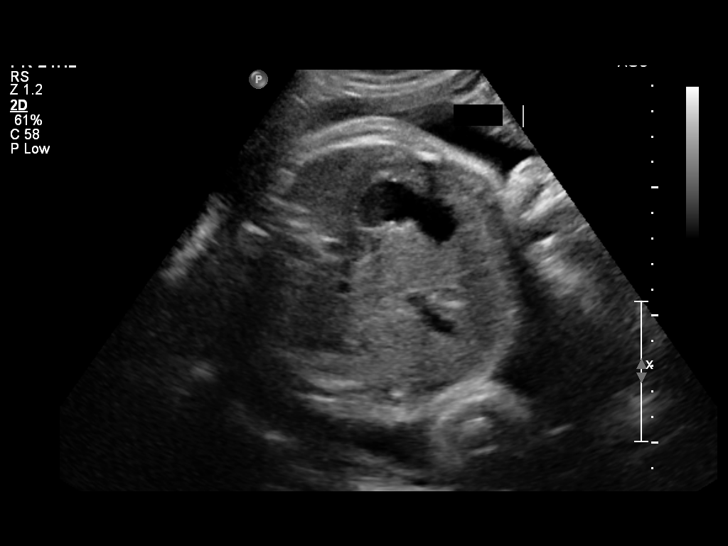
[im 14/46]
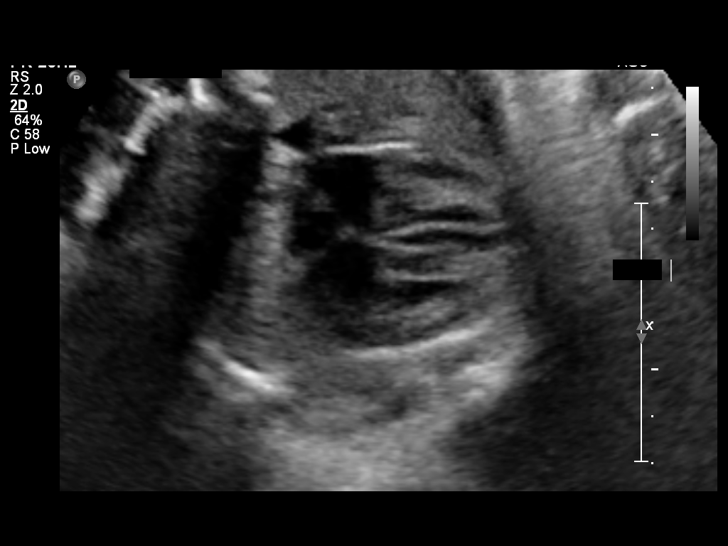
[im 17/46]
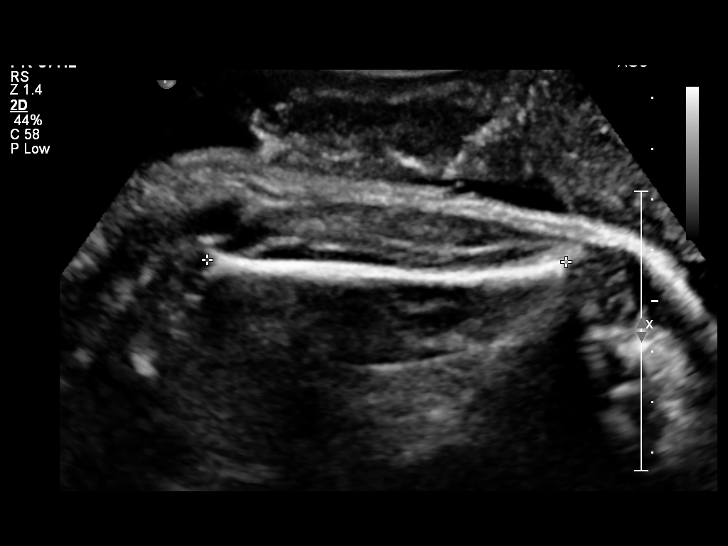
[im 21/46]
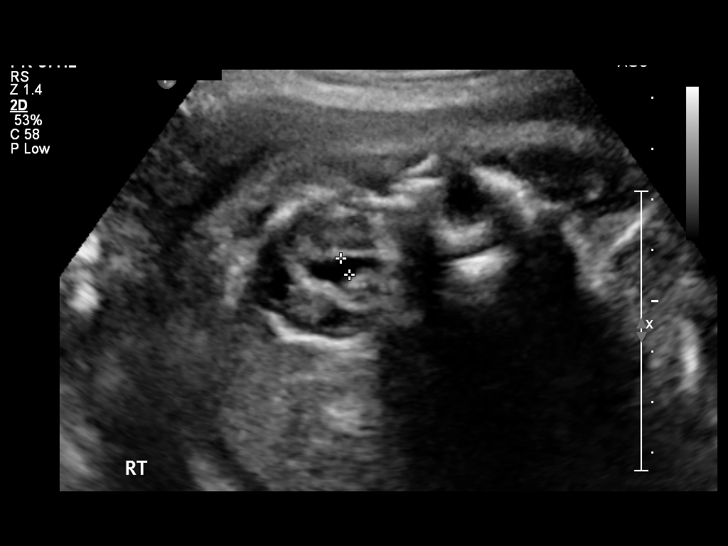
[im 26/46]
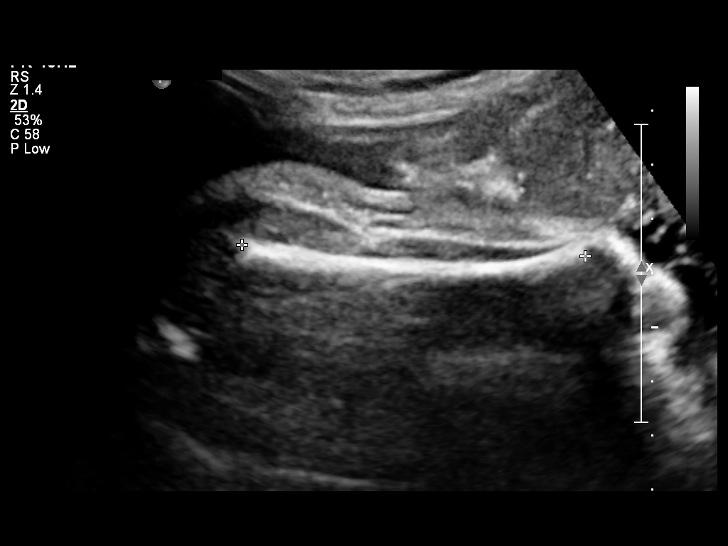
[im 29/46]
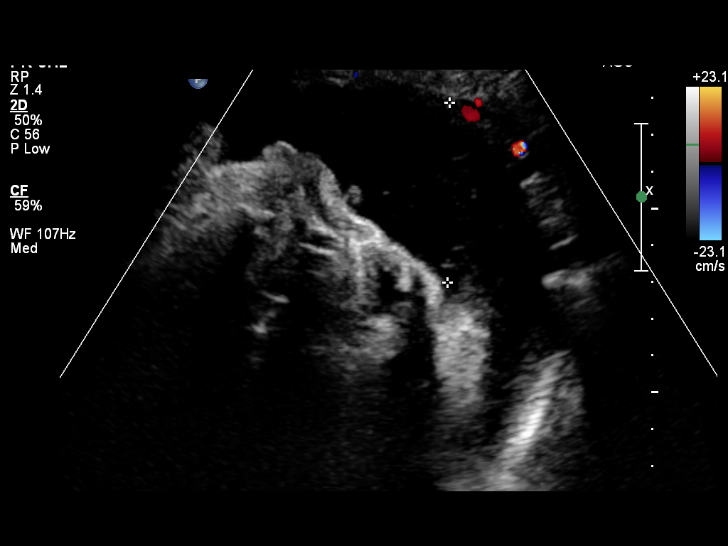
[im 32/46]
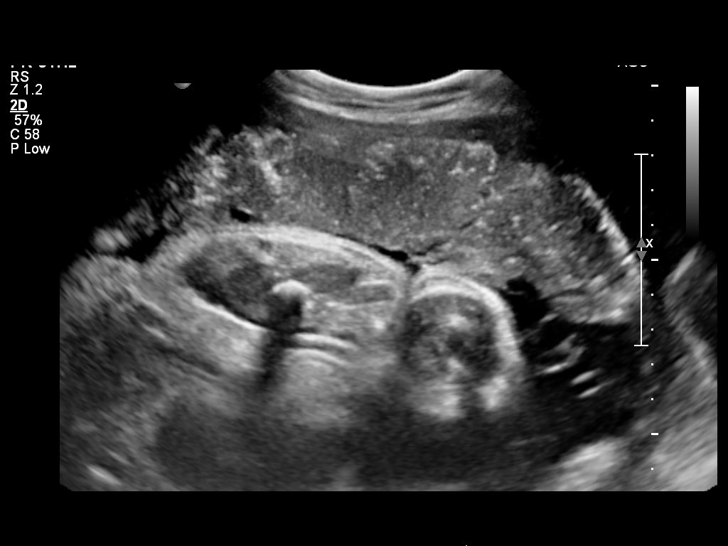
[im 37/46]
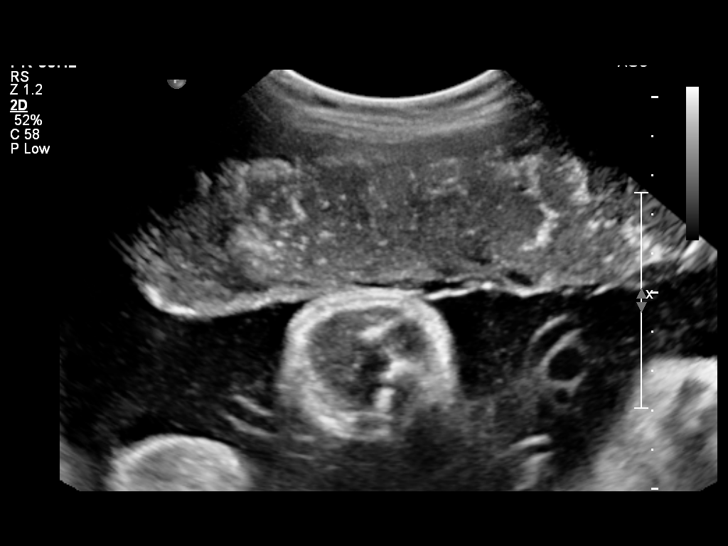
[im 41/46]
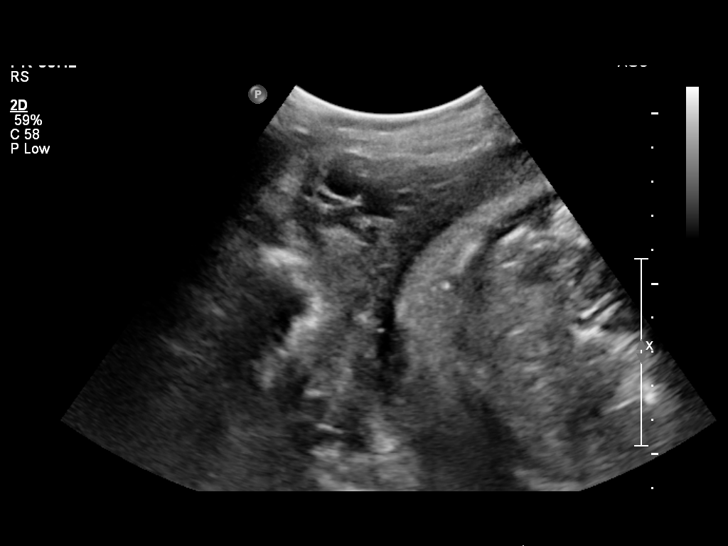
[im 44/46]
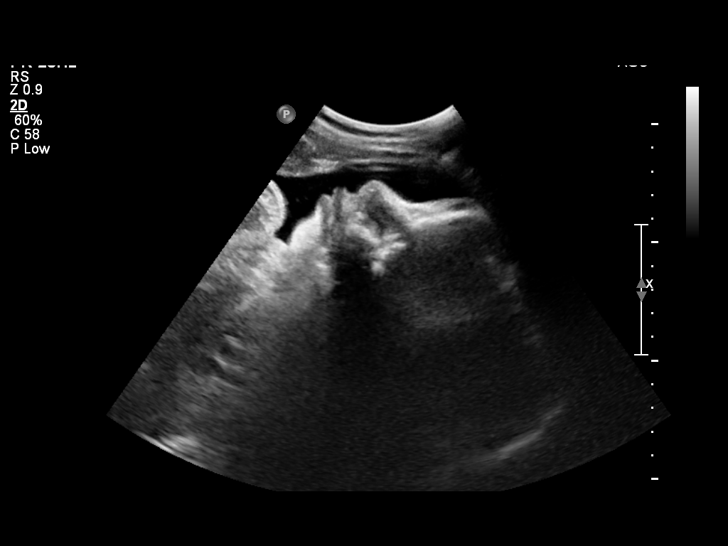

[12 of 28 positions shown; findings below may reference images not displayed]

OBSTETRICS REPORT
                      (Signed Final 07/09/2013 [DATE])

Service(s) Provided

 US OB FOLLOW UP                                       76816.1
Indications

 Follow-up incomplete fetal anatomic evaluation
 Diabetes - Gestational, A1 (diet controlled)
 Size greater than dates (Large for gestational [AGE]
Fetal Evaluation

 Num Of Fetuses:    1
 Fetal Heart Rate:  140                          bpm
 Cardiac Activity:  Observed
 Presentation:      Cephalic
 Placenta:          Anterior, above cervical os
 P. Cord            Previously Visualized
 Insertion:
 AFI Sum:     18.21   cm       72  %Tile     Larg Pckt:    5.64  cm
 RUQ:   2.08    cm   RLQ:    5.64   cm    LUQ:   5.61    cm   LLQ:    4.88   cm
Biometry

 BPD:     98.8  mm     G. Age:  40w 4d                CI:        82.21   70 - 86
                                                      FL/HC:      20.6   20.9 -

 HC:     343.8  mm     G. Age:  39w 5d       66  %    HC/AC:      0.97   0.92 -

 AC:     354.1  mm     G. Age:  39w 2d       90  %    FL/BPD:     71.6   71 - 87
 FL:      70.7  mm     G. Age:  36w 2d       10  %    FL/AC:      20.0   20 - 24
 HUM:     63.1  mm     G. Age:  36w 5d       45  %
 Est. FW:    8505  gm           8 lb     88  %
Gestational Age

 LMP:           37w 6d        Date:  10/17/12                 EDD:   07/24/13
 U/S Today:     39w 0d                                        EDD:   07/16/13
 Best:          38w 2d     Det. By:  Early Ultrasound         EDD:   07/21/13
Anatomy

 Cranium:          Appears normal         Aortic Arch:      Previously seen
 Fetal Cavum:      Previously seen        Ductal Arch:      Previously seen
 Ventricles:       Appears normal         Diaphragm:        Previously seen
 Choroid Plexus:   Previously seen        Stomach:          Appears normal, left
                                                            sided
 Cerebellum:       Previously seen        Abdomen:          Appears normal
 Posterior Fossa:  Previously seen        Abdominal Wall:   Not well visualized
 Nuchal Fold:      Not applicable (>20    Cord Vessels:     Previously seen
                   wks GA)
 Face:             Orbits and profile     Kidneys:          Appear normal
                   previously seen
 Lips:             Previously seen        Bladder:          Appears normal
 Heart:            Appears normal         Spine:            Not well visualized
                   (4CH, axis, and
                   situs)
 RVOT:             Previously seen        Lower             Previously seen
                                          Extremities:
 LVOT:             Previously seen        Upper             Previously seen
                                          Extremities:

 Other:  Heels and 5th digit previously visualized. Male gender. Technically
         difficult due to advanced GA and fetal position.
Targeted Anatomy

 Fetal Central Nervous System
 Lat. Ventricles:
Cervix Uterus Adnexa

 Cervix:       Not visualized (advanced GA >05wks)
 Uterus:       No abnormality visualized.
 Left Ovary:    Size(cm) L: 3.1 x W: 2.18 x H: 1.48  Volume(cc):
 Right Ovary:   Not visualized.

 Adnexa:     No abnormality visualized. No adnexal mass visualized.
Impression

 Single IUP at 38 [DATE] weeks
 Normal interval fetal survey.
 Cephalic presentation
 Fetal growth is appropriate (88th%'ile) with an AC at the
 90th%'ile
 Normal amniotic fluid volume
Recommendations

 Follow up ultrasounds as clinically indicated.  Please note
 that I recommend antenatal testing if hypoglycemic
 medication is started or if the patient is undelivered after 40
 weeks.

## 2016-08-27 ENCOUNTER — Encounter (HOSPITAL_COMMUNITY): Payer: Self-pay | Admitting: *Deleted

## 2016-08-27 ENCOUNTER — Inpatient Hospital Stay (HOSPITAL_COMMUNITY)
Admission: AD | Admit: 2016-08-27 | Discharge: 2016-08-27 | Disposition: A | Discharge status: Home / Self Care | Payer: PPO | Source: Ambulatory Visit | Attending: Obstetrics and Gynecology | Admitting: Obstetrics and Gynecology

## 2016-08-27 DIAGNOSIS — Z3A00 Weeks of gestation of pregnancy not specified: Secondary | ICD-10-CM | POA: Diagnosis not present

## 2016-08-27 DIAGNOSIS — O479 False labor, unspecified: Secondary | ICD-10-CM | POA: Diagnosis not present

## 2016-08-27 DIAGNOSIS — R109 Unspecified abdominal pain: Secondary | ICD-10-CM | POA: Diagnosis present

## 2016-08-27 LAB — URINALYSIS, ROUTINE W REFLEX MICROSCOPIC
Bilirubin Urine: NEGATIVE
Glucose, UA: NEGATIVE mg/dL
Hgb urine dipstick: NEGATIVE
Ketones, ur: NEGATIVE mg/dL
NITRITE: NEGATIVE
Protein, ur: NEGATIVE mg/dL
SPECIFIC GRAVITY, URINE: 1.006 (ref 1.005–1.030)
pH: 6 (ref 5.0–8.0)

## 2016-08-27 NOTE — MAU Note (Addendum)
Having abd pain since Thursday. Feels like electrical shocks in lower abd that come and go. Sometimes worse with movement. Some pain top of uterus too. No bleeding or LOF. Pt has missed last 2 appts [place] and was told could not [facility]

## 2016-08-27 NOTE — MAU Note (Signed)
I have communicated with Venia CarbonJennifer Rasch NP and reviewed vital signs:  Vitals:   08/27/16 0050 08/27/16 0242  BP: 124/71 114/71  Pulse: 76 74  Resp: 18 18  Temp: 98.2 F (36.8 C)     Vaginal exam:  Dilation: 1 Effacement (%): Thick Cervical Position: Posterior Station: -3 Presentation: Vertex Exam by:: Quintella BatonJo Barham rNC,   Also reviewed contraction pattern and that non-stress test is reactive.  It has been documented that patient is contracting irregularly and cervix is 1cm,thick, posterior, -3, vtx.  Patient denies any other complaints.  Based on this report provider has given order for discharge.  A discharge order and diagnosis entered by a provider.   Labor discharge instructions reviewed with patient.

## 2016-08-27 NOTE — Progress Notes (Signed)
Written and verbal d/c instructions given and understanding voiced. 

## 2016-08-27 NOTE — Discharge Instructions (Signed)

## 2016-08-27 NOTE — Progress Notes (Signed)
Dr Juliene PinaMody notified of pt's admission and status. Aware of ctx pattern, sve, pain in abd. PT was released from the practice in June due to non-compliance. Spoke with Venia CarbonJennifer Rasch NP who reviewed EFM strip. Pt stable for d.c home. NP will leave note for clinic to call pt next wk for appt.

## 2016-08-27 NOTE — Progress Notes (Signed)
Did not want interpreter for d/c instructions. Understanding voiced

## 2016-08-29 LAB — CULTURE, BETA STREP (GROUP B ONLY)

## 2016-08-29 NOTE — MAU Provider Note (Signed)
Patient presented with contractions. I was informed by the RN that the [facility] was dismissed [facility] due to non-compliance with prenatal appointments. Patient had care up to around [redacted] weeks gestation.   GBS collected by RN DC order given; false labor Message sent to the WOC to initiate care before delivery if possible.   Robyn Carbonasch, Robyn Wade I, NP 08/29/2016 8:48 AM

## 2016-09-06 ENCOUNTER — Inpatient Hospital Stay (HOSPITAL_COMMUNITY)
Admission: AD | Admit: 2016-09-06 | Discharge: 2016-09-08 | DRG: 775 | Disposition: A | Discharge status: Home / Self Care | Payer: PPO | Source: Ambulatory Visit | Attending: Family Medicine | Admitting: Family Medicine

## 2016-09-06 ENCOUNTER — Encounter (HOSPITAL_COMMUNITY): Payer: Self-pay

## 2016-09-06 DIAGNOSIS — Z3A38 38 weeks gestation of pregnancy: Secondary | ICD-10-CM | POA: Diagnosis not present

## 2016-09-06 DIAGNOSIS — O9902 Anemia complicating childbirth: Secondary | ICD-10-CM | POA: Diagnosis present

## 2016-09-06 DIAGNOSIS — D649 Anemia, unspecified: Secondary | ICD-10-CM | POA: Diagnosis present

## 2016-09-06 DIAGNOSIS — O26893 Other specified pregnancy related conditions, third trimester: Secondary | ICD-10-CM | POA: Diagnosis present

## 2016-09-06 LAB — COMPREHENSIVE METABOLIC PANEL
ALK PHOS: 108 U/L (ref 38–126)
ALT: 18 U/L (ref 14–54)
AST: 29 U/L (ref 15–41)
Albumin: 2.9 g/dL — ABNORMAL LOW (ref 3.5–5.0)
Anion gap: 9 (ref 5–15)
BILIRUBIN TOTAL: 0.6 mg/dL (ref 0.3–1.2)
BUN: 14 mg/dL (ref 6–20)
CALCIUM: 9.7 mg/dL (ref 8.9–10.3)
CO2: 21 mmol/L — AB (ref 22–32)
CREATININE: 0.6 mg/dL (ref 0.44–1.00)
Chloride: 105 mmol/L (ref 101–111)
Glucose, Bld: 88 mg/dL (ref 65–99)
Potassium: 3.8 mmol/L (ref 3.5–5.1)
Sodium: 135 mmol/L (ref 135–145)
Total Protein: 6.9 g/dL (ref 6.5–8.1)

## 2016-09-06 LAB — CBC
HCT: 32.1 % — ABNORMAL LOW (ref 36.0–46.0)
Hemoglobin: 9.8 g/dL — ABNORMAL LOW (ref 12.0–15.0)
MCH: 21 pg — AB (ref 26.0–34.0)
MCHC: 30.5 g/dL (ref 30.0–36.0)
MCV: 68.7 fL — AB (ref 78.0–100.0)
PLATELETS: 125 10*3/uL — AB (ref 150–400)
RBC: 4.67 MIL/uL (ref 3.87–5.11)
RDW: 17.7 % — AB (ref 11.5–15.5)
WBC: 8.5 10*3/uL (ref 4.0–10.5)

## 2016-09-06 LAB — TYPE AND SCREEN
ABO/RH(D): O POS
Antibody Screen: NEGATIVE

## 2016-09-06 MED ORDER — OXYTOCIN 40 UNITS IN LACTATED RINGERS INFUSION - SIMPLE MED
2.5000 [IU]/h | INTRAVENOUS | Status: DC
  Filled 2016-09-06: qty 1000

## 2016-09-06 MED ORDER — TETANUS-DIPHTH-ACELL PERTUSSIS 5-2.5-18.5 LF-MCG/0.5 IM SUSP: 0.5000 mL | Freq: Once | INTRAMUSCULAR | Status: DC

## 2016-09-06 MED ORDER — LIDOCAINE HCL (PF) 1 % IJ SOLN
30.0000 mL | INTRAMUSCULAR | Status: DC | PRN
  Filled 2016-09-06: qty 30

## 2016-09-06 MED ORDER — OXYTOCIN BOLUS FROM INFUSION: 500.0000 mL | Freq: Once | INTRAVENOUS | Status: DC

## 2016-09-06 MED ORDER — ACETAMINOPHEN 325 MG PO TABS
650.0000 mg | ORAL_TABLET | ORAL | Status: DC | PRN
  Administered 2016-09-07: 650 mg via ORAL
  Filled 2016-09-06: qty 2

## 2016-09-06 MED ORDER — LACTATED RINGERS IV SOLN: 500.0000 mL | INTRAVENOUS | Status: DC | PRN

## 2016-09-06 MED ORDER — SOD CITRATE-CITRIC ACID 500-334 MG/5ML PO SOLN: 30.0000 mL | ORAL | Status: DC | PRN

## 2016-09-06 MED ORDER — DIBUCAINE 1 % RE OINT: 1.0000 "application " | TOPICAL_OINTMENT | RECTAL | Status: DC | PRN

## 2016-09-06 MED ORDER — IBUPROFEN 600 MG PO TABS
600.0000 mg | ORAL_TABLET | Freq: Four times a day (QID) | ORAL | Status: DC
  Administered 2016-09-06 – 2016-09-08 (×6): 600 mg via ORAL
  Filled 2016-09-06 (×5): qty 1

## 2016-09-06 MED ORDER — COCONUT OIL OIL: 1.0000 "application " | TOPICAL_OIL | Status: DC | PRN

## 2016-09-06 MED ORDER — LACTATED RINGERS IV SOLN: INTRAVENOUS | Status: DC

## 2016-09-06 MED ORDER — FLEET ENEMA 7-19 GM/118ML RE ENEM: 1.0000 | ENEMA | RECTAL | Status: DC | PRN

## 2016-09-06 MED ORDER — FENTANYL CITRATE (PF) 100 MCG/2ML IJ SOLN
INTRAMUSCULAR | Status: AC
  Administered 2016-09-06: 100 ug via INTRAVENOUS
  Filled 2016-09-06: qty 2

## 2016-09-06 MED ORDER — ACETAMINOPHEN 325 MG PO TABS: 650.0000 mg | ORAL_TABLET | ORAL | Status: DC | PRN

## 2016-09-06 MED ORDER — ONDANSETRON HCL 4 MG/2ML IJ SOLN: 4.0000 mg | INTRAMUSCULAR | Status: DC | PRN

## 2016-09-06 MED ORDER — OXYCODONE-ACETAMINOPHEN 5-325 MG PO TABS: 2.0000 | ORAL_TABLET | ORAL | Status: DC | PRN

## 2016-09-06 MED ORDER — DIPHENHYDRAMINE HCL 25 MG PO CAPS: 25.0000 mg | ORAL_CAPSULE | Freq: Four times a day (QID) | ORAL | Status: DC | PRN

## 2016-09-06 MED ORDER — BENZOCAINE-MENTHOL 20-0.5 % EX AERO
1.0000 "application " | INHALATION_SPRAY | CUTANEOUS | Status: DC | PRN
  Administered 2016-09-07: 1 via TOPICAL
  Filled 2016-09-06: qty 56

## 2016-09-06 MED ORDER — WITCH HAZEL-GLYCERIN EX PADS: 1.0000 "application " | MEDICATED_PAD | CUTANEOUS | Status: DC | PRN

## 2016-09-06 MED ORDER — ONDANSETRON HCL 4 MG PO TABS: 4.0000 mg | ORAL_TABLET | ORAL | Status: DC | PRN

## 2016-09-06 MED ORDER — ZOLPIDEM TARTRATE 5 MG PO TABS: 5.0000 mg | ORAL_TABLET | Freq: Every evening | ORAL | Status: DC | PRN

## 2016-09-06 MED ORDER — MEASLES, MUMPS & RUBELLA VAC ~~LOC~~ INJ: 0.5000 mL | INJECTION | Freq: Once | SUBCUTANEOUS | Status: DC

## 2016-09-06 MED ORDER — OXYCODONE-ACETAMINOPHEN 5-325 MG PO TABS: 1.0000 | ORAL_TABLET | ORAL | Status: DC | PRN

## 2016-09-06 MED ORDER — PRENATAL MULTIVITAMIN CH
1.0000 | ORAL_TABLET | Freq: Every day | ORAL | Status: DC
  Administered 2016-09-07: 1 via ORAL

## 2016-09-06 MED ORDER — SENNOSIDES-DOCUSATE SODIUM 8.6-50 MG PO TABS
2.0000 | ORAL_TABLET | ORAL | Status: DC
  Administered 2016-09-06 – 2016-09-08 (×2): 2 via ORAL
  Filled 2016-09-06 (×2): qty 2

## 2016-09-06 MED ORDER — ONDANSETRON HCL 4 MG/2ML IJ SOLN: 4.0000 mg | Freq: Four times a day (QID) | INTRAMUSCULAR | Status: DC | PRN

## 2016-09-06 MED ORDER — FENTANYL CITRATE (PF) 100 MCG/2ML IJ SOLN
100.0000 ug | INTRAMUSCULAR | Status: DC | PRN
  Administered 2016-09-06 (×2): 100 ug via INTRAVENOUS
  Filled 2016-09-06: qty 2

## 2016-09-06 MED ORDER — SIMETHICONE 80 MG PO CHEW: 80.0000 mg | CHEWABLE_TABLET | ORAL | Status: DC | PRN

## 2016-09-06 NOTE — H&P (Signed)
LABOR ADMISSION HISTORY AND PHYSICAL  Jalene Demo is a 21 y.o. female G2P1001 with IUP at [redacted]w[redacted]d by LMP presenting for SOL. H/o of A1GDM during first pregnancy. She reports +FMs, No LOF, no VB, no blurry vision, no headaches, no peripheral edema, and no RUQ pain.  She plans on breast feeding. She requests nothing for birth control.  Dating: By LMP --->  Estimated Date of Delivery: 09/14/16   Prenatal History/Complications: patient had care at [facility] during the first 4 months of pregnancy and then no care afterwards- unable to establish care.   Past Medical History: Past Medical History:  Diagnosis Date  . Diabetes mellitus without complication (HCC)    gestational    Past Surgical History: Past Surgical History:  Procedure Laterality Date  . NO PAST SURGERIES      Obstetrical History: OB History    Gravida Para Term Preterm AB Living   2 1 1     1    SAB TAB Ectopic Multiple Live Births           1      Social History: Social History   Social History  . Marital status: Married    Spouse name: N/A  . Number of children: N/A  . Years of education: N/A   Social History Main Topics  . Smoking status: Never Smoker  . Smokeless tobacco: Never Used  . Alcohol use No  . Drug use: No  . Sexual activity: Yes    Birth control/ protection: None   Other Topics Concern  . None   Social History Narrative  . None    Family History: Family History  Problem Relation Age of Onset  . Alcohol abuse Neg Hx   . Arthritis Neg Hx   . Asthma Neg Hx   . Birth defects Neg Hx   . Cancer Neg Hx   . COPD Neg Hx   . Diabetes Neg Hx   . Depression Neg Hx   . Drug abuse Neg Hx   . Early death Neg Hx   . Hearing loss Neg Hx   . Heart disease Neg Hx   . Hyperlipidemia Neg Hx   . Hypertension Neg Hx   . Kidney disease Neg Hx   . Learning disabilities Neg Hx   . Mental illness Neg Hx   . Mental retardation Neg Hx   . Miscarriages / Stillbirths Neg Hx   . Stroke Neg Hx   .  Vision loss Neg Hx   . Varicose Veins Neg Hx     Allergies: No Known Allergies  Prescriptions Prior to Admission  Medication Sig Dispense Refill Last Dose  . calcium carbonate (TUMS - DOSED IN MG ELEMENTAL CALCIUM) 500 MG chewable tablet Chew 1 tablet by mouth daily.   Past Week at Unknown time  . Doxylamine-Pyridoxine 10-10 MG TBEC Take 2 tablets by mouth 2 (two) times daily. DO NOT TAKE MORE THAN 4 TABLETS DAILY. 60 tablet 5 01/22/2016 at Unknown time  . nitrofurantoin, macrocrystal-monohydrate, (MACROBID) 100 MG capsule Take 1 capsule (100 mg total) by mouth 2 (two) times daily. 14 capsule 0 01/22/2016 at Unknown time  . polyethylene glycol powder (GLYCOLAX/MIRALAX) powder Take 17 g by mouth daily as needed for moderate constipation. 500 g 2   . Prenatal Vit-Fe Fumarate-FA (PRENATAL MULTIVITAMIN) TABS tablet Take 1 tablet by mouth daily at 12 noon. 30 tablet 1 01/23/2016 at Unknown time  . promethazine (PHENERGAN) 25 MG tablet Take 1 tablet (25 mg  total) by mouth every 6 (six) hours as needed. 30 tablet 2      Review of Systems   All systems reviewed and negative except as stated in HPI  Blood pressure 117/79, pulse 84, temperature 98.1 F (36.7 C), temperature source Oral, resp. rate 18, last menstrual period 11/16/2015, SpO2 100 %, unknown if currently breastfeeding. General appearance: alert and cooperative Lungs: normal work of breathing Extremities: Homans sign is negative, no sign of DVT Presentation: cephalic Fetal monitoringBaseline: 135 bpm, Variability: Good {> 6 bpm), Accelerations: Reactive and Decelerations: Absent Uterine activityFrequency: Every 1.5-4 minutes Dilation: 7.5 Effacement (%): 90 Station: -2 Exam by:: Janeth Rasehristina Robinson RNC   Prenatal labs: ABO, Rh: --/--/O POS (08/07 1716)O Pos Antibody: PENDING (08/07 1716)Negative Rubella: Non-immune RPR:   Non-reactive HBsAg:   Negative HIV:   Non-reactive GBS:   negative GTT: Fasting- 85, 1 hr- 118, 2 hr-  72  Prenatal Transfer Tool  Maternal Diabetes: No Genetic Screening: Normal Maternal Ultrasounds/Referrals: Normal Fetal Ultrasounds or other Referrals:  None Maternal Substance Abuse:  No Significant Maternal Medications:  None Significant Maternal Lab Results: Lab values include: Group B Strep negative  Results for orders placed or performed during the hospital encounter of 09/06/16 (from the past 24 hour(s))  CBC   Collection Time: 09/06/16  5:16 PM  Result Value Ref Range   WBC 8.5 4.0 - 10.5 K/uL   RBC 4.67 3.87 - 5.11 MIL/uL   Hemoglobin 9.8 (L) 12.0 - 15.0 g/dL   HCT 16.132.1 (L) 09.636.0 - 04.546.0 %   MCV 68.7 (L) 78.0 - 100.0 fL   MCH 21.0 (L) 26.0 - 34.0 pg   MCHC 30.5 30.0 - 36.0 g/dL   RDW 40.917.7 (H) 81.111.5 - 91.415.5 %   Platelets 125 (L) 150 - 400 K/uL  Type and screen [facility]   Collection Time: 09/06/16  5:16 PM  Result Value Ref Range   ABO/RH(D) O POS    Antibody Screen PENDING    Sample Expiration 09/09/2016     Patient Active Problem List   Diagnosis Date Noted  . Normal labor 09/06/2016  . Abnormal maternal glucose tolerance, antepartum 06/10/2013  . Anemia complicating pregnancy in first trimester 01/10/2013  . Pyelonephritis complicating pregnancy in first trimester, antepartum 01/03/2013  . Language barrier, cultural differences 01/01/2013  . Supervision of normal intrauterine pregnancy in primigravida 01/01/2013    Assessment: Irena CordsWaad F Al Shaytah is a 21 y.o. G2P1001 at 6992w6d here for SOL, pre-natal care with [facility] for first 4 months and then none after- unable to establish care  #Labor: Patient requested to speed up process by AROM- moderate amount of meconium stained fluid #Pain: Patient requesting pain medication through the IV  #FWB: Cat 1 #ID: GBS negative #MOF: breast #MOC: none #Circ: unknown # Anticipate SVD  [place] Prashant Glosser, DO Family Medicine Resident PGY-1  09/06/2016, 5:59 PM

## 2016-09-06 NOTE — Progress Notes (Signed)
Admission assessment completed with interpreter [phone]

## 2016-09-06 NOTE — MAU Note (Signed)
+  contractions 2 mins  Denies LOF, VB  +FM  1cm last week in office

## 2016-09-07 LAB — RPR: RPR Ser Ql: NONREACTIVE

## 2016-09-07 MED ORDER — IBUPROFEN 600 MG PO TABS: 600.0000 mg | ORAL_TABLET | Freq: Four times a day (QID) | ORAL | 0 refills | Status: AC

## 2016-09-07 MED ORDER — OXYCODONE-ACETAMINOPHEN 5-325 MG PO TABS
1.0000 | ORAL_TABLET | Freq: Four times a day (QID) | ORAL | Status: DC | PRN
  Filled 2016-09-07: qty 1

## 2016-09-07 NOTE — Discharge Instructions (Signed)

## 2016-09-07 NOTE — Progress Notes (Signed)
Post Partum Day 1 Subjective: Patient seen and examined with stratus video interpreter. up ad lib, voiding, tolerating PO, + flatus and has pain and cramping. Would like to go home today once baby is discharged if possible. Was told to let us know and we can out int he discharge. She is also wanting follow up with the office here, since she was unable to establish care elsewhere during pregnancy.   Objective: Blood pressure 122/80, pulse 61, temperature 98.2 F (36.8 C), temperature source Oral, resp. rate 18, last menstrual period 11/16/2015, SpO2 100 %, unknown if currently breastfeeding.  Physical Exam:  General: alert, cooperative and mild distress Lochia: appropriate Uterine Fundus: firm Incision: n/a DVT Evaluation: No evidence of DVT seen on physical exam. Negative Homan's sign. No significant calf/ankle edema.   Recent Labs  09/06/16 1716  HGB 9.8*  HCT 32.1*    Assessment/Plan: Discharge home today pending baby discharge [facility]   LOS: 1 day   Robyn Wade 09/07/2016, 7:53 AM   CNM attestation Post Partum Day #1 I have seen and examined this patient and agree with above documentation in the resident's note.   Robyn Wade is a 21 y.o. N5A2130G2P2002 s/p SVD.  Pt denies problems with ambulating, voiding or po intake. Pain is well controlled.  Plan for birth control is no method.  Method of Feeding: breast  PE:  BP 122/80 (BP Location: Left Arm)   Pulse 61   Temp 98.2 F (36.8 C) (Oral)   Resp 18   LMP 11/16/2015   SpO2 100%   Breastfeeding? Unknown  Fundus firm  Plan for discharge: 09/08/16- would like discharge today if possible Will get circ for infant  Cam HaiSHAW, KIMBERLY, CNM 8:29 AM  09/07/2016

## 2016-09-07 NOTE — Plan of Care (Signed)
Problem: Pain Management: Goal: Relief or control of pain from uterine contractions will improve Outcome: Progressing Patient experiencing increased abdominal cramping Given heat pack, Tylenol and Motrin.  MD aware.  Order for Percocet prn

## 2016-09-07 NOTE — Discharge Summary (Signed)
OB Discharge Summary     Patient Name: Robyn Wade DOB: 1995/12/27 MRN: [id number]  Date of admission: 09/06/2016 Delivering MD: SHIRLEY, [place]   Date of discharge: 09/08/2016  Admitting diagnosis: 39WKS CTX Intrauterine pregnancy: [redacted]w[redacted]d     Secondary diagnosis:  Active Problems:   Normal labor  Additional problems:  Patient Active Problem List   Diagnosis Date Noted  . Normal labor 09/06/2016  . Abnormal maternal glucose tolerance, antepartum 06/10/2013  . Anemia complicating pregnancy in first trimester 01/10/2013  . Pyelonephritis complicating pregnancy in first trimester, antepartum 01/03/2013  . Language barrier, cultural differences 01/01/2013  . Supervision of normal intrauterine pregnancy in primigravida 01/01/2013      Discharge diagnosis: Term Pregnancy Delivered and Anemia                                                                                                Post partum procedures:none  Augmentation: AROM  Complications: None  Hospital course:  Onset of Labor With Vaginal Delivery     21 y.o. yo [id number] at [redacted]w[redacted]d was admitted in Active Labor on 09/06/2016. Patient had an uncomplicated labor course as follows:  Membrane Rupture Time/Date: 5:50 PM ,09/06/2016   Intrapartum Procedures: Episiotomy: None [1]                                         Lacerations:  None [1]  Patient had a delivery of a Viable infant. 09/06/2016  Information for the patient's newborn:  Ellicia, Alix [[id number]]  Delivery Method: Vag-Spont    Patient was seen with a stratus video interpreter. She had an uncomplicated postpartum course. She would like follow up with the office here. She did not have pre-natal care after 4 months and was unable to establish care after this. She is ambulating, tolerating a regular diet, passing flatus, and urinating well. Patient is discharged home in stable condition on 09/08/16.   Physical exam  Vitals:   09/07/16 0135 09/07/16 0513 09/07/16  1806 09/08/16 0500  BP: 113/68 122/80 (!) 108/56 118/71  Pulse: 63 61 69 82  Resp: 16 18 17 18   Temp: 98.7 F (37.1 C) 98.2 F (36.8 C) 98.5 F (36.9 C) 98.3 F (36.8 C)  TempSrc: Oral Oral Oral Oral  SpO2:       General: alert, cooperative and no distress Lochia: appropriate Uterine Fundus: firm Incision: N/A DVT Evaluation: No evidence of DVT seen on physical exam. Negative Homan's sign. No significant calf/ankle edema. Labs: Lab Results  Component Value Date   WBC 8.5 09/06/2016   HGB 9.8 (L) 09/06/2016   HCT 32.1 (L) 09/06/2016   MCV 68.7 (L) 09/06/2016   PLT 125 (L) 09/06/2016   CMP Latest Ref Rng & Units 09/06/2016  Glucose 65 - 99 mg/dL 88  BUN 6 - 20 mg/dL 14  Creatinine 1.30 - 8.65 mg/dL 7.84  Sodium 696 - 295 mmol/L 135  Potassium 3.5 - 5.1 mmol/L 3.8  Chloride 101 - 111 mmol/L 105  CO2 22 - 32 mmol/L 21(L)  Calcium 8.9 - 10.3 mg/dL 9.7  Total Protein 6.5 - 8.1 g/dL 6.9  Total Bilirubin 0.3 - 1.2 mg/dL 0.6  Alkaline Phos 38 - 126 U/L 108  AST 15 - 41 U/L 29  ALT 14 - 54 U/L 18    Discharge instruction: per After Visit Summary and "Baby and Me Booklet".  After visit meds:  Allergies as of 09/08/2016   No Known Allergies     Medication List    STOP taking these medications   Doxylamine-Pyridoxine 10-10 MG Tbec   nitrofurantoin (macrocrystal-monohydrate) 100 MG capsule Commonly known as:  MACROBID   promethazine 25 MG tablet Commonly known as:  PHENERGAN     TAKE these medications   calcium carbonate 500 MG chewable tablet Commonly known as:  TUMS - dosed in mg elemental calcium Chew 1 tablet by mouth 2 (two) times daily as needed for indigestion or heartburn.   ibuprofen 600 MG tablet Commonly known as:  ADVIL,MOTRIN Take 1 tablet (600 mg total) by mouth every 6 (six) hours.   loratadine 10 MG tablet Commonly known as:  CLARITIN Take 1 tablet by mouth daily as needed.   polyethylene glycol powder powder Commonly known as:   GLYCOLAX/MIRALAX Take 17 g by mouth daily as needed for moderate constipation.   prenatal multivitamin Tabs tablet Take 1 tablet by mouth daily at 12 noon.       Diet: routine diet  Activity: Advance as tolerated. Pelvic rest for 6 weeks.   Outpatient follow up:4 weeks Follow up Appt:No future appointments. Follow up Visit:No Follow-up on file.  Postpartum contraception: None  Newborn Data: Live born female  Birth Weight: 7 lb 4.4 oz (3300 g) APGAR: 8, 9  Baby Feeding: Breast Disposition:home with mother   09/08/2016 [place], DO  CNM attestation I have seen and examined this patient and agree with above documentation in the resident's note.   Irena CordsWaad F Al Shaytah is a 21 y.o. [id number] s/p SVD.   Pain is well controlled.  Plan for birth control is no method.  Method of Feeding: breast  PE:  BP 118/71 (BP Location: Left Arm)   Pulse 82   Temp 98.3 F (36.8 C) (Oral)   Resp 18   LMP 11/16/2015   SpO2 100%   Breastfeeding? Unknown  Fundus firm   Recent Labs  09/06/16 1716  HGB 9.8*  HCT 32.1*     Plan: discharge today - postpartum care discussed - f/u clinic in 4 weeks for postpartum visit   Robyn Wade, Robyn Wade, CNM 8:57 AM 09/08/2016

## 2016-09-07 NOTE — Lactation Note (Signed)
This note was copied from a baby's chart. Lactation Consultation Note  Patient Name: Robyn Wade [id number]'WToday's Date: 09/07/2016 Reason for consult: Initial assessment   Initial assessment with Exp BF mom of 17 hour old infant. Spoke with parents with assistance of [employer] Interpreter Eyman # [phone].   Infant currently asleep in crib. Infant with 3 BF for 20-45 minutes, 1 BF attempt, 1 formula feeding of 15 cc via bottle, 1 void and 1 stool (Light MSF) since birth. Infant weight 7 lb with 3% weight loss since birth. LATCH scores 7-9. Mom reports she BF her first child for 16 months with occasional formula supplementation.   Enc mom to feed infant STS 8-12 x in 24 hours at first feeding cues offering both breasts with each feeding for as long as infant desires. Enc mom to call out for assistance as needed. Mom reports she knows how to hand express, enc mom to hand express before feeding to get milk flowing and after feeding to apply EBM to nipples. Mom reports she has no questions/concerns at this time.   BF Resources Handout and LC Brochure given, mom informed of IP/OP Services, BF Support Groups and LC phone #. Mom does not have a pump at home, Dad reports they have [employer] insurance, he is going to call and see if they offer a breast pump.      Maternal Data Formula Feeding for Exclusion: No Has patient been taught Hand Expression?: Yes Does the patient have breastfeeding experience prior to this delivery?: Yes  Feeding Feeding Type: Breast Fed  LATCH Score Latch: Grasps breast easily, tongue down, lips flanged, rhythmical sucking.  Audible Swallowing: A few with stimulation  Type of Nipple: Everted at rest and after stimulation  Comfort (Breast/Nipple): Soft / non-tender  Hold (Positioning): No assistance needed to correctly position infant at breast.  LATCH Score: 9  Interventions Interventions: Breast feeding basics reviewed;Support pillows;Hand  express  Lactation Tools Discussed/Used WIC Program: No   Consult Status Consult Status: Follow-up Date: 09/08/16 Follow-up type: In-patient    Silas FloodSharon S Danea Manter 09/07/2016, 11:41 AM

## 2016-09-07 NOTE — Progress Notes (Signed)
During rounding, this RN saw a can on powdered Sim19 on pt's bedside table. There was also a small bottle that pt had brought from home with about 1.5 oz of formula in it. This RN asked the pt if she had given the baby any of the formula that was seen on the bedside table and mom responded "just a little." Mom unable to tell this RN how much she actually gave baby; but judging from instructions on can and how much was left in the bottle baby may have received approx 1/2 oz of the formula. This RN asked mom if she will breast or bottle feed her baby when she gets home and mom responded "I will do both." This RN took mom the ready-to-feed bottles of Sim19 and instructed mom to use those bottles instead of the can of powdered formula she got from home. Mom agreed to do so. Feeding amounts sheet discussed with mom.

## 2016-09-08 NOTE — Progress Notes (Signed)
POSTPARTUM PROGRESS NOTE  Post Partum Day 2  Subjective:  Robyn Wade is a 21 y.o. [id number] s/p SVD at 6732w6d.  No acute events overnight. Pt denies problems with voiding or po intake. She denies nausea or vomiting. She denies headaches or blurry vision. Pain is well controlled. Lochia small.  Objective: Blood pressure 118/71, pulse 82, temperature 98.3 F (36.8 C), temperature source Oral, resp. rate 18, last menstrual period 11/16/2015, SpO2 100 %, unknown if currently breastfeeding.  Physical Exam:  General: alert, cooperative and no distress Chest: no respiratory distress Abdomen: soft, nontender,  Uterine Fundus: firm, appropriately tender DVT Evaluation: No calf swelling or tenderness Extremities: No edema Skin: warm, dry   Recent Labs  09/06/16 1716  HGB 9.8*  HCT 32.1*    Assessment/Plan: Robyn Wade is a 21 y.o. [id number] s/p SVD at 1332w6d   PPD#2 - Doing well, ready to go home Contraception: none Feeding: Breastfeeding, lactation consulted Dispo: Plan for discharge 8/9. Follow up appt scheduled with our clinic.  Due to language barrier, an Arabic interpreter was present during the history-taking and subsequent discussion (and for part of the physical exam) with this patient.   LOS: 2 days   Dannette BarbaraDrew [place] Wanner 09/08/2016, 7:44 AM

## 2016-09-12 ENCOUNTER — Encounter: Payer: PPO | Admitting: Family Medicine

## 2016-09-14 ENCOUNTER — Encounter: Payer: Self-pay | Admitting: Advanced Practice Midwife

## 2016-10-13 ENCOUNTER — Ambulatory Visit: Payer: PPO | Admitting: Advanced Practice Midwife

## 2016-10-13 ENCOUNTER — Encounter: Payer: Self-pay | Admitting: General Practice

## 2016-10-26 ENCOUNTER — Ambulatory Visit: Payer: PPO | Admitting: Medical
